# Patient Record
Sex: Female | Born: 1990 | Race: White | Hispanic: No | Marital: Single | Smoking: Never smoker
Health system: Southern US, Community
[De-identification: ages and names within clinical notes are randomized; demographics above are authoritative.]

## PROBLEM LIST (undated history)

## (undated) DIAGNOSIS — IMO0002 Reserved for concepts with insufficient information to code with codable children: Secondary | ICD-10-CM

## (undated) DIAGNOSIS — Z975 Presence of (intrauterine) contraceptive device: Secondary | ICD-10-CM

## (undated) DIAGNOSIS — N63 Unspecified lump in unspecified breast: Secondary | ICD-10-CM

## (undated) HISTORY — DX: Presence of (intrauterine) contraceptive device: Z97.5

## (undated) HISTORY — PX: INTRAUTERINE DEVICE (IUD) INSERTION: SHX5877

## (undated) HISTORY — DX: Reserved for concepts with insufficient information to code with codable children: IMO0002

## (undated) HISTORY — PX: COLPOSCOPY: SHX161

---
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
## 2008-02-07 ENCOUNTER — Ambulatory Visit: Payer: Self-pay | Admitting: Obstetrics and Gynecology

## 2010-05-15 ENCOUNTER — Emergency Department (HOSPITAL_BASED_OUTPATIENT_CLINIC_OR_DEPARTMENT_OTHER)
Admission: EM | Admit: 2010-05-15 | Discharge: 2010-05-15 | Payer: Self-pay | Source: Home / Self Care | Admitting: Emergency Medicine

## 2010-05-23 HISTORY — PX: BREAST BIOPSY: SHX20

## 2010-08-02 LAB — CBC
HCT: 39.4 % (ref 36.0–46.0)
Hemoglobin: 14.4 g/dL (ref 12.0–15.0)
MCV: 78.2 fL (ref 78.0–100.0)
WBC: 6.4 10*3/uL (ref 4.0–10.5)

## 2010-08-02 LAB — DIFFERENTIAL
Basophils Absolute: 0 10*3/uL (ref 0.0–0.1)
Basophils Relative: 1 % (ref 0–1)
Eosinophils Absolute: 0.1 10*3/uL (ref 0.0–0.7)
Eosinophils Relative: 2 % (ref 0–5)
Monocytes Absolute: 0.3 10*3/uL (ref 0.1–1.0)
Monocytes Relative: 5 % (ref 3–12)

## 2010-08-02 LAB — BASIC METABOLIC PANEL
CO2: 24 mEq/L (ref 19–32)
Calcium: 9.6 mg/dL (ref 8.4–10.5)
GFR calc Af Amer: >60 mL/min (ref >60)
GFR calc non Af Amer: >60 mL/min (ref >60)
Sodium: 142 mEq/L (ref 135–145)

## 2010-08-02 LAB — POCT TOXICOLOGY PANEL

## 2010-08-02 LAB — ETHANOL: Alcohol, Ethyl (B): 209 mg/dL — ABNORMAL HIGH (ref 0–10)

## 2010-09-14 ENCOUNTER — Ambulatory Visit: Payer: Self-pay | Admitting: Obstetrics and Gynecology

## 2010-09-30 ENCOUNTER — Other Ambulatory Visit (HOSPITAL_COMMUNITY)
Admission: RE | Admit: 2010-09-30 | Discharge: 2010-09-30 | Disposition: A | Discharge status: Home / Self Care | Payer: Commercial Indemnity | Source: Ambulatory Visit | Attending: Obstetrics and Gynecology | Admitting: Obstetrics and Gynecology

## 2010-09-30 ENCOUNTER — Other Ambulatory Visit: Payer: Self-pay | Admitting: Obstetrics and Gynecology

## 2010-09-30 ENCOUNTER — Ambulatory Visit (INDEPENDENT_AMBULATORY_CARE_PROVIDER_SITE_OTHER): Payer: Commercial Indemnity | Admitting: Obstetrics and Gynecology

## 2010-09-30 DIAGNOSIS — Z23 Encounter for immunization: Secondary | ICD-10-CM

## 2010-09-30 DIAGNOSIS — Z124 Encounter for screening for malignant neoplasm of cervix: Secondary | ICD-10-CM | POA: Insufficient documentation

## 2010-09-30 DIAGNOSIS — Z01419 Encounter for gynecological examination (general) (routine) without abnormal findings: Secondary | ICD-10-CM

## 2010-09-30 DIAGNOSIS — Z113 Encounter for screening for infections with a predominantly sexual mode of transmission: Secondary | ICD-10-CM

## 2010-10-14 ENCOUNTER — Ambulatory Visit (INDEPENDENT_AMBULATORY_CARE_PROVIDER_SITE_OTHER): Payer: Commercial Indemnity | Admitting: Obstetrics and Gynecology

## 2010-10-14 ENCOUNTER — Ambulatory Visit: Payer: Commercial Indemnity | Admitting: Obstetrics and Gynecology

## 2010-10-14 ENCOUNTER — Other Ambulatory Visit: Payer: Self-pay | Admitting: Obstetrics and Gynecology

## 2010-10-14 DIAGNOSIS — N870 Mild cervical dysplasia: Secondary | ICD-10-CM

## 2010-10-22 DIAGNOSIS — Z975 Presence of (intrauterine) contraceptive device: Secondary | ICD-10-CM

## 2010-10-22 HISTORY — DX: Presence of (intrauterine) contraceptive device: Z97.5

## 2010-11-10 ENCOUNTER — Ambulatory Visit (INDEPENDENT_AMBULATORY_CARE_PROVIDER_SITE_OTHER): Payer: Commercial Indemnity | Admitting: Obstetrics and Gynecology

## 2010-11-10 DIAGNOSIS — Z30431 Encounter for routine checking of intrauterine contraceptive device: Secondary | ICD-10-CM

## 2010-11-10 DIAGNOSIS — Z113 Encounter for screening for infections with a predominantly sexual mode of transmission: Secondary | ICD-10-CM

## 2010-11-25 ENCOUNTER — Ambulatory Visit (INDEPENDENT_AMBULATORY_CARE_PROVIDER_SITE_OTHER): Payer: Commercial Indemnity | Admitting: Obstetrics and Gynecology

## 2010-11-25 DIAGNOSIS — N6019 Diffuse cystic mastopathy of unspecified breast: Secondary | ICD-10-CM

## 2010-11-25 DIAGNOSIS — N946 Dysmenorrhea, unspecified: Secondary | ICD-10-CM

## 2011-01-13 ENCOUNTER — Ambulatory Visit: Payer: Commercial Indemnity

## 2011-02-01 ENCOUNTER — Other Ambulatory Visit: Payer: Self-pay | Admitting: *Deleted

## 2011-02-01 ENCOUNTER — Ambulatory Visit (INDEPENDENT_AMBULATORY_CARE_PROVIDER_SITE_OTHER): Payer: Commercial Indemnity | Admitting: *Deleted

## 2011-02-01 DIAGNOSIS — Z23 Encounter for immunization: Secondary | ICD-10-CM

## 2011-05-24 DIAGNOSIS — IMO0002 Reserved for concepts with insufficient information to code with codable children: Secondary | ICD-10-CM

## 2011-05-24 HISTORY — DX: Reserved for concepts with insufficient information to code with codable children: IMO0002

## 2011-06-14 ENCOUNTER — Encounter: Payer: Self-pay | Admitting: *Deleted

## 2011-06-14 NOTE — Progress Notes (Signed)
Patient ID: Kathleen Carrillo, female   DOB: 1990-08-05, 21 y.o.   MRN: [id number] Pt mother(Jill) called wanting to know if daughter had 1 more shot for gardasil. Pt last shot will be this month. She will relay to daughter

## 2011-06-20 ENCOUNTER — Ambulatory Visit: Payer: Commercial Indemnity

## 2011-06-27 ENCOUNTER — Ambulatory Visit (INDEPENDENT_AMBULATORY_CARE_PROVIDER_SITE_OTHER): Payer: Commercial Indemnity | Admitting: Anesthesiology

## 2011-06-27 DIAGNOSIS — Z23 Encounter for immunization: Secondary | ICD-10-CM

## 2011-06-28 ENCOUNTER — Encounter: Payer: Self-pay | Admitting: Gynecology

## 2011-06-28 DIAGNOSIS — IMO0002 Reserved for concepts with insufficient information to code with codable children: Secondary | ICD-10-CM | POA: Insufficient documentation

## 2011-07-08 ENCOUNTER — Encounter: Payer: Self-pay | Admitting: Obstetrics and Gynecology

## 2011-07-08 ENCOUNTER — Other Ambulatory Visit (HOSPITAL_COMMUNITY)
Admission: RE | Admit: 2011-07-08 | Discharge: 2011-07-08 | Disposition: A | Discharge status: Home / Self Care | Payer: Commercial Indemnity | Source: Ambulatory Visit | Attending: Obstetrics and Gynecology | Admitting: Obstetrics and Gynecology

## 2011-07-08 ENCOUNTER — Ambulatory Visit (INDEPENDENT_AMBULATORY_CARE_PROVIDER_SITE_OTHER): Payer: Commercial Indemnity | Admitting: Obstetrics and Gynecology

## 2011-07-08 DIAGNOSIS — N949 Unspecified condition associated with female genital organs and menstrual cycle: Secondary | ICD-10-CM

## 2011-07-08 DIAGNOSIS — N898 Other specified noninflammatory disorders of vagina: Secondary | ICD-10-CM

## 2011-07-08 DIAGNOSIS — R102 Pelvic and perineal pain: Secondary | ICD-10-CM

## 2011-07-08 DIAGNOSIS — N870 Mild cervical dysplasia: Secondary | ICD-10-CM

## 2011-07-08 DIAGNOSIS — Z01419 Encounter for gynecological examination (general) (routine) without abnormal findings: Secondary | ICD-10-CM | POA: Insufficient documentation

## 2011-07-08 LAB — URINALYSIS W MICROSCOPIC + REFLEX CULTURE
Hgb urine dipstick: NEGATIVE
Nitrite: NEGATIVE
Specific Gravity, Urine: 1.02 (ref 1.005–1.030)
Urobilinogen, UA: 0.2 mg/dL (ref 0.0–1.0)

## 2011-07-08 LAB — WET PREP FOR TRICH, YEAST, CLUE
Clue Cells Wet Prep HPF POC: NONE SEEN
Yeast Wet Prep HPF POC: NONE SEEN

## 2011-07-08 NOTE — Progress Notes (Signed)
Patient came to see me today because she is concerned about her IUD. She feels pinching at the level of her cervix 1-2 times per week. It is not uncomfortable. Intercourse is fine. She is having no vaginal discharge, itching, or odor. She is due for a followup Pap smear. She has amenorrhea with just occasional light staining for a period.  Pelvic exam: External within normal limits. BUS within normal limits. Vaginal exam within normal limits. Cervix is clean without lesions. IUD string visible. Uterus is normal size and shape. Adnexa failed to reveal masses. Rectovaginal examination is confirmatory and without masses. Theotis Barrio present.  Urinalysis and wet prep negative.  Assessment: Normal GYN exam. CIN-1.  Plan: Patient reassured. Pap done. Advise followup in 6 months. Instructed patient on how to feel for her IUD string.

## 2011-08-09 ENCOUNTER — Ambulatory Visit (INDEPENDENT_AMBULATORY_CARE_PROVIDER_SITE_OTHER): Payer: Commercial Indemnity | Admitting: Obstetrics and Gynecology

## 2011-08-09 DIAGNOSIS — D069 Carcinoma in situ of cervix, unspecified: Secondary | ICD-10-CM

## 2011-08-09 DIAGNOSIS — N870 Mild cervical dysplasia: Secondary | ICD-10-CM

## 2011-08-09 NOTE — Progress Notes (Signed)
Subjective:     Patient ID: Kathleen Carrillo, female   DOB: April 06, 1991, 21 y.o.   MRN: [id number]  HPIlast year we did a colposcopy on the patient due to an abnormal Pap smear. Biopsy showed CIN-1 and the plan was to observe. Recently we did a followup Pap which showed CIN-1 with some cells suggestive of high-grade dysplasia. As a result of this she comes today for followup colposcopy.   Review of Systemsno change     Objective:   Physical Exam  Genitourinary:    colposcopy     Assessment:    CIN    Plan:    biopsies done of #2-#4. We will call patient with path

## 2012-01-10 ENCOUNTER — Other Ambulatory Visit (HOSPITAL_COMMUNITY)
Admission: RE | Admit: 2012-01-10 | Discharge: 2012-01-10 | Disposition: A | Discharge status: Home / Self Care | Payer: Commercial Indemnity | Source: Ambulatory Visit | Attending: Obstetrics and Gynecology | Admitting: Obstetrics and Gynecology

## 2012-01-10 ENCOUNTER — Encounter: Payer: Self-pay | Admitting: Obstetrics and Gynecology

## 2012-01-10 ENCOUNTER — Ambulatory Visit (INDEPENDENT_AMBULATORY_CARE_PROVIDER_SITE_OTHER): Payer: Commercial Indemnity | Admitting: Obstetrics and Gynecology

## 2012-01-10 VITALS — BP 112/60 | Ht 64.0 in | Wt 122.0 lb

## 2012-01-10 DIAGNOSIS — Z01419 Encounter for gynecological examination (general) (routine) without abnormal findings: Secondary | ICD-10-CM

## 2012-01-10 DIAGNOSIS — R102 Pelvic and perineal pain: Secondary | ICD-10-CM

## 2012-01-10 DIAGNOSIS — Z113 Encounter for screening for infections with a predominantly sexual mode of transmission: Secondary | ICD-10-CM

## 2012-01-10 DIAGNOSIS — N898 Other specified noninflammatory disorders of vagina: Secondary | ICD-10-CM

## 2012-01-10 DIAGNOSIS — L293 Anogenital pruritus, unspecified: Secondary | ICD-10-CM

## 2012-01-10 DIAGNOSIS — D249 Benign neoplasm of unspecified breast: Secondary | ICD-10-CM

## 2012-01-10 DIAGNOSIS — N949 Unspecified condition associated with female genital organs and menstrual cycle: Secondary | ICD-10-CM

## 2012-01-10 LAB — CBC WITH DIFFERENTIAL/PLATELET
HCT: 38.9 % (ref 36.0–46.0)
Lymphs Abs: 1.7 10*3/uL (ref 0.7–4.0)
MCV: 83.5 fL (ref 78.0–100.0)
Monocytes Absolute: 0.4 10*3/uL (ref 0.1–1.0)
Neutrophils Relative %: 65 % (ref 43–77)
RBC: 4.66 MIL/uL (ref 3.87–5.11)
RDW: 12.7 % (ref 11.5–15.5)
WBC: 6.6 10*3/uL (ref 4.0–10.5)

## 2012-01-10 LAB — WET PREP FOR TRICH, YEAST, CLUE: Clue Cells Wet Prep HPF POC: NONE SEEN

## 2012-01-10 MED ORDER — TERCONAZOLE 0.8 % VA CREA: 1.0000 | TOPICAL_CREAM | Freq: Every day | VAGINAL | Status: AC

## 2012-01-10 NOTE — Progress Notes (Signed)
Patient came to see me today for her annual GYN exam. Last year her Pap smear showed CIN-1. She underwent colposcopy with biopsy on 10/14/2010 with findings CIN-1. In February, 2013 patient had a followup Pap smear which showed CIN-1 with some cells suggestive of a higher grade lesion. She had a second colposcopy with biopsy which only showed CIN-1. She has a persistent breast lesion in her left breast which has been biopsied and is consistent with a fibroadenoma. She is currently having vaginal itching. She has had dyspareunia on deep penetration the last 3 times they had intercourse. She is having light staining every cycle with a Mirena IUD and occasional dysmenorrhea but no heavy bleeding.  Physical examination: kim Julian Reil present. HEENT within normal limits. Neck: Thyroid not large. No masses. Supraclavicular nodes: not enlarged. Breasts: Examined in both sitting and lying  position. No skin changes and no masses in right breast. In the left breast at 6:00 there is a 1.5 cm dominant nodule biopsy-proven fibroadenoma. Abdomen: Soft no guarding rebound or masses or hernia. Pelvic: External: Within normal limits. BUS: Within normal limits. Vaginal:within normal limits. Wet prep positive for yeast. Good estrogen effect. No evidence of cystocele rectocele or enterocele. Cervix: clean. IUD string visible. Uterus: Normal size and shape but tender. Adnexa: No masses. Rectovaginal exam: Confirmatory and negative. Extremities: Within normal limits.  Assessment: #1. CIN-1 #2. Dyspareunia #3. Fibroadenoma left breast #4. Yeast vaginitis  Plan: Terconazole 3 cream. Pelvic ultrasound. We'll decide on next Pap smear after receiving results of this 1.

## 2012-01-10 NOTE — Patient Instructions (Addendum)
Schedule ultrasound

## 2012-01-11 LAB — URINALYSIS W MICROSCOPIC + REFLEX CULTURE
Bacteria, UA: NONE SEEN
Hgb urine dipstick: NEGATIVE
Specific Gravity, Urine: 1.021 (ref 1.005–1.030)

## 2012-01-12 LAB — URINE CULTURE: Organism ID, Bacteria: NO GROWTH

## 2012-01-13 ENCOUNTER — Ambulatory Visit (INDEPENDENT_AMBULATORY_CARE_PROVIDER_SITE_OTHER): Payer: Commercial Indemnity

## 2012-01-13 ENCOUNTER — Ambulatory Visit (INDEPENDENT_AMBULATORY_CARE_PROVIDER_SITE_OTHER): Payer: Commercial Indemnity | Admitting: Obstetrics and Gynecology

## 2012-01-13 DIAGNOSIS — R102 Pelvic and perineal pain: Secondary | ICD-10-CM

## 2012-01-13 DIAGNOSIS — IMO0002 Reserved for concepts with insufficient information to code with codable children: Secondary | ICD-10-CM

## 2012-01-13 DIAGNOSIS — N949 Unspecified condition associated with female genital organs and menstrual cycle: Secondary | ICD-10-CM

## 2012-01-13 NOTE — Progress Notes (Signed)
The patient came back today to have an ultrasound because of dyspareunia. She has a Mirena IUD and I want to be sure it is not misplaced or that she does not have any pathology. On ultrasound she has a normal uterus with a Mirena IUD properly placed. Her endometrial echo is 3 mm. Her ovaries are both normal. Her cul-de-sac is free of fluid. The patient was reassured. I am hopeful that when her yeast infection is treated her dyspareunia will disappear. She will let  me know if it does not. Her Pap smear came back showing low-grade dysplasia. I informed her today. Based on new guidelines we will repeat it in one year.

## 2012-04-23 ENCOUNTER — Ambulatory Visit (INDEPENDENT_AMBULATORY_CARE_PROVIDER_SITE_OTHER): Payer: Commercial Indemnity | Admitting: Obstetrics and Gynecology

## 2012-04-23 DIAGNOSIS — B373 Candidiasis of vulva and vagina: Secondary | ICD-10-CM

## 2012-04-23 DIAGNOSIS — B3731 Acute candidiasis of vulva and vagina: Secondary | ICD-10-CM

## 2012-04-23 DIAGNOSIS — L293 Anogenital pruritus, unspecified: Secondary | ICD-10-CM

## 2012-04-23 DIAGNOSIS — N898 Other specified noninflammatory disorders of vagina: Secondary | ICD-10-CM

## 2012-04-23 LAB — WET PREP FOR TRICH, YEAST, CLUE
Trich, Wet Prep: NONE SEEN
Yeast Wet Prep HPF POC: NONE SEEN

## 2012-04-23 NOTE — Patient Instructions (Signed)
Continue yearly Pap smears.

## 2012-04-23 NOTE — Progress Notes (Signed)
Patient came in today to be sure she did not have a yeast infection. We have treated her with terconazole 3 cream. She was still having a little itching and discharge so she used to fourth night. She is currently not bothered by either discharge or itching.  Exam: Kennon Portela present. External and vaginal exam within normal limits without excessive discharge or evidence of irritation. Wet prep is negative. Cervix is clean. IUD string is visible.  Assessment: Yeast vaginitis  Plan: Patient reassured. Patient's Pap showed CIN-1. She has now had 2 colposcopies without evidence of high-grade dysplasia. I suggested she have another Pap smear at her annual exam.

## 2012-05-11 ENCOUNTER — Ambulatory Visit (INDEPENDENT_AMBULATORY_CARE_PROVIDER_SITE_OTHER): Payer: Commercial Indemnity | Admitting: Gynecology

## 2012-05-11 ENCOUNTER — Encounter: Payer: Self-pay | Admitting: Gynecology

## 2012-05-11 DIAGNOSIS — K1370 Unspecified lesions of oral mucosa: Secondary | ICD-10-CM

## 2012-05-11 NOTE — Progress Notes (Signed)
Patient presents with a several day history of a crusting area on her lower lip that she was worried could be HSV. Her prior boyfriend had HSV 1 and she wondered whether he could have given to her. Her boss at work also has HSV 1 and she's worried that she could have gotten this from touching objects that her boss touched. She does have a habit of biting her lower lip and this seemed to follow an episode of this also.  Exam HEENT with small linear erythematous area junction of her lip and skin inferiorly. No crusting weeping or ulcerative changes. Exam otherwise normal without adenopathy or other abnormalities.  Assessment and plan: Probable impetigo as it does not appear classic for HSV. Plan OTC antibiotic cream several times daily. Assuming it clears and will follow if it persists or worsen she knows to represent. I did recommend HSV 1 and 2 IgG levels just answer the question as to whether her boyfriend had exposure previously she'll get these drawn today.

## 2012-05-11 NOTE — Patient Instructions (Signed)
Use Neosporin type antibiotic cream on her lip 3-4 times daily. If area worsens or persists then follow up. Her resolve some will follow. Follow up for your blood results online

## 2012-05-13 LAB — HSV(HERPES SIMPLEX VRS) I + II AB-IGG: HSV 1 Glycoprotein G Ab, IgG: 1.2 IV — ABNORMAL HIGH

## 2012-09-24 ENCOUNTER — Ambulatory Visit: Payer: Commercial Indemnity | Admitting: Gynecology

## 2012-12-10 ENCOUNTER — Ambulatory Visit: Payer: Self-pay | Admitting: Nurse Practitioner

## 2012-12-14 ENCOUNTER — Ambulatory Visit (INDEPENDENT_AMBULATORY_CARE_PROVIDER_SITE_OTHER): Payer: Self-pay | Admitting: Gynecology

## 2012-12-14 ENCOUNTER — Encounter: Payer: Self-pay | Admitting: Gynecology

## 2012-12-14 VITALS — BP 100/74 | HR 66 | Resp 16 | Ht 63.75 in | Wt <= 1120 oz

## 2012-12-14 DIAGNOSIS — Z87898 Personal history of other specified conditions: Secondary | ICD-10-CM

## 2012-12-14 DIAGNOSIS — B009 Herpesviral infection, unspecified: Secondary | ICD-10-CM

## 2012-12-14 DIAGNOSIS — Z0000 Encounter for general adult medical examination without abnormal findings: Secondary | ICD-10-CM

## 2012-12-14 DIAGNOSIS — Z01419 Encounter for gynecological examination (general) (routine) without abnormal findings: Secondary | ICD-10-CM

## 2012-12-14 DIAGNOSIS — Z113 Encounter for screening for infections with a predominantly sexual mode of transmission: Secondary | ICD-10-CM

## 2012-12-14 DIAGNOSIS — Z8742 Personal history of other diseases of the female genital tract: Secondary | ICD-10-CM

## 2012-12-14 LAB — POCT URINALYSIS DIPSTICK
Leukocytes, UA: NEGATIVE
Urobilinogen, UA: NEGATIVE
pH, UA: 5

## 2012-12-14 MED ORDER — VALACYCLOVIR HCL 1 G PO TABS: 1000.0000 mg | ORAL_TABLET | Freq: Every day | ORAL | Status: DC

## 2012-12-14 NOTE — Progress Notes (Signed)
22 y.o. Single Caucasian female   G0P0 here for annual exam. Pt is  currently sexually active.  She reports not using condoms on a regular basis.  First sexual activity at 22 years old, 3 number of lifetime partners. Pt reports some dyspareunia in past, denies issue with dryness, current partner for week.  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  Patient's last menstrual period was 12/14/2009.          Sexually active: yes  The current method of family planning is IUD.    Exercising: yes  running, weight lifting 2x/wk Last pap: 01/10/12 LGSIL Alcohol:  Wine 1-2/wk Tobacco: no Drugs: no Gardisil: yes, completed: 2013  Urine: Negative  Health Maintenance  Topic Date Due  . Tetanus/tdap  07/17/2007  . Influenza Vaccine  01/21/2013  . Pap Smear  01/10/2015    Family History  Problem Relation Age of Onset  . Hypertension Father   . Heart disease Maternal Grandfather   . Diabetes Maternal Grandfather     Patient Active Problem List   Diagnosis Date Noted  . 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 LGSIL (low grade squamous intraepithelial dysplasia)     Past Medical History  Diagnosis Date  . LGSIL (low grade squamous intraepithelial dysplasia) 2012    Past Surgical History  Procedure Laterality Date  . Colposcopy    . Breast biopsy Left 2012    Allergies: Sudafed and Dayquil  Current Outpatient Prescriptions  Medication Sig Dispense Refill  . UNABLE TO FIND Med Name: Herbal Hair Skin and Nails.      Marland Kitchen levonorgestrel (MIRENA) 20 MCG/24HR IUD 1 each by Intrauterine route once.       No current facility-administered medications for this visit.    ROS: Pertinent items are noted in HPI.  Exam:    BP 100/74  Pulse 66  Resp 16  Ht 5' 3.75" (1.619 m)  Wt 18 lb (8.165 kg)   BMI 3.12 kg/m2  LMP 12/14/2009 Weight change: @WEIGHTCHANGE @ Last 3 height recordings:  Ht Readings from Last 3 Encounters:  12/14/12 5' 3.75" (1.619 m)  01/10/12 5\' 4"  (1.626 m)   General appearance: alert, cooperative and appears stated age Head: Normocephalic, without obvious abnormality, atraumatic Neck: no adenopathy, no carotid bruit, no JVD, supple, symmetrical, trachea midline and thyroid not enlarged, symmetric, no tenderness/mass/nodules Lungs: clear to auscultation bilaterally Breasts: normal appearance, no masses or tenderness, fibrocystic Heart: regular rate and rhythm, S1, S2 normal, no murmur, click, rub or gallop Abdomen: soft, non-tender; bowel sounds normal; no masses,  no organomegaly Extremities: extremities normal, atraumatic, no cyanosis or edema Skin: Skin color, texture, turgor normal. No rashes or lesions Lymph nodes: Cervical, supraclavicular, and axillary nodes normal. no inguinal nodes palpated Neurologic: Grossly normal   Pelvic: External genitalia:  no lesions              Urethra: normal appearing urethra with no masses, tenderness or lesions              Bartholins and Skenes: normal                 Vagina: normal appearing vagina with normal color and discharge, no lesions              Cervix: normal appearance  Pap taken: yes        Bimanual Exam:  Uterus:  uterus is normal size, shape, consistency and nontender                                      Adnexa:    normal adnexa in size, nontender and no masses                                      Rectovaginal: Deferred                                      Anus:  defer exam  A: well woman Contraceptive management STD screen     P: pap smear with reflex counseled on STD prevention return annually or prn Discussed STD prevention, regular condom use. Discussed HSV outbreak, to avoid sharing razors, towels etc with siblings, suggested trying suppression. Discussed difference between oral  and non-oral lesions, may need to consider suppression, will call in valtrex Spent extra 26m discussion HSV .50% face to face    An After Visit Summary was printed and given to the patient.

## 2012-12-15 LAB — GC/CHLAMYDIA PROBE AMP, URINE
Chlamydia, Swab/Urine, PCR: NEGATIVE
GC Probe Amp, Urine: NEGATIVE

## 2012-12-25 ENCOUNTER — Encounter: Payer: Self-pay | Admitting: Gynecology

## 2013-04-04 ENCOUNTER — Telehealth: Payer: Self-pay | Admitting: Gynecology

## 2013-04-04 NOTE — Telephone Encounter (Signed)
Attempt to return call, no answer and VM msg states "VM not set up".

## 2013-04-04 NOTE — Telephone Encounter (Signed)
Patient had questions for lathrop about what she could take for an outbreak on her face... Anything that could be prescribed or over the counter.(no chart)

## 2013-04-05 NOTE — Telephone Encounter (Signed)
Pt says rx for valtrex is $200.00. Wondering if there is anything else that can be prescribed for her because she is still having the outbreak on her face and would like to make sure valtrex is going to work before she spend $200.00.

## 2013-04-05 NOTE — Telephone Encounter (Signed)
Patient has not filled RX written back in July due to cost.  Now has outbreak on her face and was considering picking up RX if med would work for oral outbreak. Advised the medication is the same but when written specifically for oral outbreaks, dose may be different. She states she does not have insurance and needs least expensive option. Advised to make sure they are giving her generic and to check at other pharm for best cash price. She can not come in due to cost.  Wont be able to pick up medication until next Wed. Advised i will send to dr Farrel Gobble to see if there is another option and call her back next week.

## 2013-04-08 NOTE — Telephone Encounter (Signed)
She can try acyclovir 400mg  BID and can give her less-5d worth to keep cost down, [facility] is cheapest, no membership needed, see if that works for her and we can call in

## 2013-04-09 NOTE — Telephone Encounter (Signed)
Patient was calling to see if you had heard from lathrop. She said she goes in at 2 so if it is after that then call he at work.

## 2013-04-09 NOTE — Telephone Encounter (Signed)
Patient notified of Dr Liliana Cline instruction.  Patient has actually taken this before has an old bottle that she has run out of.  Only problem was that while she was on it, she had another outbreak occur.  Advised can try again and see is this clears her and is not, call back.  If gets another outbreak, can check with Dr Farrel Gobble regarding daily dose.  Patient states she knows this was less expensive so she would actually like to have 30 pills instead of just 10. RX to [facility].

## 2013-04-11 ENCOUNTER — Other Ambulatory Visit: Payer: Self-pay | Admitting: Orthopedic Surgery

## 2013-04-11 MED ORDER — ACYCLOVIR 400 MG PO TABS: 400.0000 mg | ORAL_TABLET | Freq: Two times a day (BID) | ORAL | Status: DC

## 2013-04-11 NOTE — Telephone Encounter (Signed)
Patient says her prescription is  not at her pharmacy. Please call with status.

## 2013-04-11 NOTE — Telephone Encounter (Addendum)
Dr. Farrel Gobble,   OK for acyclovir 400 mg BID?  Pt would like 30 pills as she knows this med is cheaper. Sent to [facility] in [place].

## 2013-04-12 NOTE — Telephone Encounter (Signed)
yes

## 2013-04-25 ENCOUNTER — Other Ambulatory Visit: Payer: Self-pay | Admitting: Gynecology

## 2013-09-30 ENCOUNTER — Other Ambulatory Visit: Payer: Self-pay | Admitting: Gynecology

## 2013-09-30 NOTE — Telephone Encounter (Signed)
Last EAX 12/14/12 Last refill 04/25/13 #30/1 refill No future appt.   Rx sent per Telephone note from 04/04/13. 30 days only. Patient will need AEX for further refills.

## 2013-10-07 ENCOUNTER — Telehealth: Payer: Self-pay | Admitting: Gynecology

## 2013-10-07 NOTE — Telephone Encounter (Signed)
Patient has some questions for the nurse about acyclovir. She is hoping for a new RX with larger doses as she is requesting refills often.  [facility] [address] [place]

## 2013-10-08 NOTE — Telephone Encounter (Signed)
Dr. Charlies Constable,  Patient is requesting medication refills of Acyclovir 400 mg bid. She is taking daily and still having oral outbreaks. She feels tingling on her face and lesions come in. I advised that per your note on refill from 09/30/13 that she will need office visit to discuss and for further refills.  Patient is agreeable and office visit scheduled for 10/23/13 with Dr. Charlies Constable. She states she has enough medication to last until appointment but would like to get rx for longer length of therapy.   Routing to provider for final review. Patient agreeable to disposition. Will close encounter

## 2013-10-23 ENCOUNTER — Ambulatory Visit: Payer: Self-pay | Admitting: Gynecology

## 2013-10-30 ENCOUNTER — Ambulatory Visit (INDEPENDENT_AMBULATORY_CARE_PROVIDER_SITE_OTHER): Payer: Self-pay | Admitting: Gynecology

## 2013-10-30 ENCOUNTER — Encounter: Payer: Self-pay | Admitting: Gynecology

## 2013-10-30 VITALS — BP 108/62 | HR 80 | Resp 14 | Ht 63.75 in | Wt 118.0 lb

## 2013-10-30 DIAGNOSIS — B009 Herpesviral infection, unspecified: Secondary | ICD-10-CM

## 2013-10-30 MED ORDER — ACYCLOVIR 400 MG PO TABS: 400.0000 mg | ORAL_TABLET | Freq: Two times a day (BID) | ORAL | Status: DC

## 2013-10-30 MED ORDER — PENCICLOVIR 1 % EX CREA: 1.0000 "application " | TOPICAL_CREAM | CUTANEOUS | Status: DC

## 2013-10-30 NOTE — Progress Notes (Signed)
Pt here with mother.  Pt states that she could not afford valtrex for HSV infection and instead is taking acyclovir, she will take twice a day and stop to stretch out her rx but will then feel facial tingling.  She states the tingling will resolve several hours after she takes it.  Pt states that she only had 2 other genital lesions since her first almost 1y ago. We discussed the exected decrease in flares as time goes on after initial exposure to HSV. She can chose to take the acyclovir twice a day or can try to take once a day and increase to twice daily if she is having symptoms of outbreaks.  If she finds that she is getting frequent outbreaks on the lower dose, she may want to stay at twice a day for an additional year. We refilled acyclovir BID #60 refill 9 Rx denavir for topical treatment  F/u late October 67m spent counseling, >50% face to face

## 2014-01-17 ENCOUNTER — Telehealth: Payer: Self-pay | Admitting: Gynecology

## 2014-01-17 NOTE — Telephone Encounter (Signed)
Spoke with patient. Patient states that she has noticed a "bad odor" for a month and a half. Was tested at a free clinic and was told that she did not have a bacterial infection but symptoms have continued after. "I can smell it through my clothes." Denies itching. "I always have discharge." Patient also needs to schedule follow up to discuss valtrex with Dr.Lathrop. States that she has not been taking two pills a day as "I have just gotten so busy but I always at least take one." Patient does not currently have insurance and states that she just wants to come in for this appointment and not her annual at this time. Advised annual is over due and will need to be schedule at appointment. Patient agreeable. Advised patient would check with Dr.Lathrop about best day and time and call patient back. She is agreeable.

## 2014-01-17 NOTE — Telephone Encounter (Signed)
Spoke with patient. Advised spoke with Dr.Lathrop and Starla. Patient will slide card for $350 at beginning of visit but cost will be adjusted based on what is done at appointment as patient is self pay. Per Dr.Lathrop patient will need annual with pap and wet prep. Patient is looking at around $150 for appointment. Patient is agreeable and would like to schedule. Offered appointment Wed 9/2 at 10:30am (per TL) but patient declines due to work schedule. Patient can only come in on Tuesday and Friday. Appointment scheduled for Friday at 11am. Patient agreeable to date and time.  Routing to provider for final review. Patient agreeable to disposition. Will close encounter

## 2014-01-17 NOTE — Telephone Encounter (Signed)
Pt received letter to call and schedule recheck appointment and she thinks she may have a bacterial infection.

## 2014-01-24 ENCOUNTER — Ambulatory Visit: Payer: Self-pay | Admitting: Gynecology

## 2014-02-14 ENCOUNTER — Encounter: Payer: Self-pay | Admitting: Gynecology

## 2014-02-14 ENCOUNTER — Ambulatory Visit (INDEPENDENT_AMBULATORY_CARE_PROVIDER_SITE_OTHER): Payer: Self-pay | Admitting: Gynecology

## 2014-02-14 ENCOUNTER — Ambulatory Visit: Payer: Self-pay | Admitting: Gynecology

## 2014-02-14 VITALS — BP 106/60 | HR 64 | Resp 16 | Ht 63.75 in | Wt 117.0 lb

## 2014-02-14 DIAGNOSIS — Z124 Encounter for screening for malignant neoplasm of cervix: Secondary | ICD-10-CM

## 2014-02-14 DIAGNOSIS — N898 Other specified noninflammatory disorders of vagina: Secondary | ICD-10-CM

## 2014-02-14 DIAGNOSIS — Z01419 Encounter for gynecological examination (general) (routine) without abnormal findings: Secondary | ICD-10-CM

## 2014-02-14 DIAGNOSIS — B009 Herpesviral infection, unspecified: Secondary | ICD-10-CM

## 2014-02-14 MED ORDER — ACYCLOVIR 400 MG PO TABS: 400.0000 mg | ORAL_TABLET | Freq: Two times a day (BID) | ORAL | Status: DC

## 2014-02-14 NOTE — Progress Notes (Signed)
23 y.o. single, Caucasian female   G0P0 here for annual exam. Pt is not currently sexually active.   First sexual activity at 22 years old, 6 number of lifetime partners. Pt states that her facial HSV outbreaks are less intense and less frequent. Rare withdraw bleeding with IUD.   Pt reports vaginal odor, itch, change in vaginal discharge and dryness.    No LMP recorded. Patient is not currently having periods (Reason: IUD).          Sexually active: No.  The current method of family planning is IUD.    Exercising: Yes.    walk Last pap:12-14-12 neg, 2013-LGSIL Alcohol: 6 glasses of wine a week Tobacco: none Drugs: none Gardisil: yes, completed: 2013 Self breast exam: done occ   Health Maintenance  Topic Date Due  . Tetanus/tdap  07/17/2007  . Influenza Vaccine  12/21/2013  . Pap Smear  12/15/2015    Family History  Problem Relation Age of Onset  . Hypertension Father   . Heart disease Maternal Grandfather   . Diabetes Maternal Grandfather     Patient Active Problem List   Diagnosis Date Noted  . 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 LGSIL (low grade squamous intraepithelial dysplasia)     Past Medical History  Diagnosis Date  . LGSIL (low grade squamous intraepithelial dysplasia) 2013  . IUD contraception 10/2010    mirena    Past Surgical History  Procedure Laterality Date  . Colposcopy    . Breast biopsy Left 2012    Allergies: Sudafed and Dayquil  Current Outpatient Prescriptions  Medication Sig Dispense Refill  . acyclovir (ZOVIRAX) 400 MG tablet Take 1 tablet (400 mg total) by mouth 2 (two) times daily.  60 tablet  9  . levonorgestrel (MIRENA) 20 MCG/24HR IUD 1 each by Intrauterine route once.      Marland Kitchen LYSINE PO Take by mouth daily.       No current facility-administered medications for this visit.    ROS: Pertinent items are noted in HPI.  Exam:    BP 106/60  Pulse 64  Resp 16  Ht 5' 3.75" (1.619 m)  Wt 117 lb (53.071  kg)  BMI 20.25 kg/m2 Weight change: @WEIGHTCHANGE @ Last 3 height recordings:  Ht Readings from Last 3 Encounters:  02/14/14 5' 3.75" (1.619 m)  10/30/13 5' 3.75" (1.619 m)  12/14/12 5' 3.75" (1.619 m)   General appearance: alert, cooperative and appears stated age Head: Normocephalic, without obvious abnormality, atraumatic Neck: no adenopathy, no carotid bruit, no JVD, supple, symmetrical, trachea midline and thyroid not enlarged, symmetric, no tenderness/mass/nodules Lungs: clear to auscultation bilaterally Breasts: normal appearance, no masses or tenderness Heart: regular rate and rhythm, S1, S2 normal, no murmur, click, rub or gallop Abdomen: soft, non-tender; bowel sounds normal; no masses,  no organomegaly Extremities: extremities normal, atraumatic, no cyanosis or edema Skin: Skin color, texture, turgor normal. No rashes or lesions Lymph nodes: Cervical, supraclavicular, and axillary nodes normal. no inguinal nodes palpated Neurologic: Grossly normal   Pelvic: External genitalia:  no lesions              Urethra: normal appearing urethra with no masses, tenderness or lesions              Bartholins and Skenes: Bartholin's, Urethra, Skene's normal                 Vagina: normal appearing vagina with normal color and discharge, no lesions  Cervix: normal appearance, stings noted              Pap taken: Yes.          Bimanual Exam:  Uterus:  uterus is normal size, shape, consistency and nontender                                      Adnexa:    normal adnexa in size, nontender and no masses                                      Rectovaginal: Confirms                                      Anus:  normal sphincter tone, no lesions   1. Routine gynecological examination  counseled on breast self exam, feminine hygiene, adequate intake of calcium and vitamin D, diet and exercise return annually or prn Discussed STD prevention, regular condom use.   2. Vaginal  discharge No signs of infection today, will treat based on PAP results, pt agreeable  3. HSV-1 infection Natural course of infection reviewed - acyclovir (ZOVIRAX) 400 MG tablet; Take 1 tablet (400 mg total) by mouth 2 (two) times daily.  Dispense: 60 tablet; Refill: 11  4. Screening for cervical cancer LGSIL 2013, normal 2014, guidelines reviewed - PAP with Reflex to HPV (IPS)   An After Visit Summary was printed and given to the patient.

## 2014-02-18 LAB — IPS PAP TEST WITH REFLEX TO HPV

## 2014-02-19 ENCOUNTER — Telehealth: Payer: Self-pay | Admitting: Gynecology

## 2014-02-19 NOTE — Telephone Encounter (Signed)
Pt says someone called her with results. No telephone call in system.

## 2014-02-19 NOTE — Telephone Encounter (Signed)
Spoke with patient. Advised of pap smear results. Patient states "So there was no yeast or bacterial infection?" Advised none was noted with pap smear. Patient states that she has been having a vaginal odor with her discharge and does not know what to do. Advised patient would send a message over to Dr.Lathrop to see what she recommends and give her a call back with further recommendations. Patient is agreeable.

## 2014-02-27 NOTE — Telephone Encounter (Signed)
Dr. Charlies Constable can you please review. Should patient come in for office visit? You advised you would treat based on pap and pap was normal.

## 2014-02-28 NOTE — Telephone Encounter (Signed)
Returned call to patient. She states that she has taken Dr. Brion Aliment advise and changed denim jeans and has not had any issues. She will follow up prn.  Routing to provider for final review. Patient agreeable to disposition. Will close encounter

## 2014-02-28 NOTE — Telephone Encounter (Signed)
There was no infection to treat, I don't think she needs to be seen again, but it is really up to her

## 2014-03-04 ENCOUNTER — Ambulatory Visit: Payer: Self-pay | Admitting: Gynecology

## 2014-07-01 ENCOUNTER — Other Ambulatory Visit: Payer: Self-pay | Admitting: Obstetrics & Gynecology

## 2014-07-01 DIAGNOSIS — B009 Herpesviral infection, unspecified: Secondary | ICD-10-CM

## 2014-07-01 NOTE — Telephone Encounter (Signed)
Medication refill request: acyclovir 400 mg Last AEX: 02/14/14 Next AEX: none Last MMG (if hormonal medication request): None Refill authorized: 02/14/14 #[id number]/ 11 Refills to [facility]. Patient should have refills left until 12/2014.   Called pharmacy, Caryl Pina states patient has Refills on file.   Patient notified.

## 2014-07-01 NOTE — Telephone Encounter (Signed)
Patient calling requesting refills on acyclovir. Pharmacy on file is correct.

## 2014-07-02 ENCOUNTER — Emergency Department (HOSPITAL_COMMUNITY)
Admission: EM | Admit: 2014-07-02 | Discharge: 2014-07-02 | Disposition: A | Discharge status: Home / Self Care | Payer: Commercial Indemnity | Attending: Emergency Medicine | Admitting: Emergency Medicine

## 2014-07-02 ENCOUNTER — Encounter (HOSPITAL_COMMUNITY): Payer: Self-pay | Admitting: Emergency Medicine

## 2014-07-02 DIAGNOSIS — Z8742 Personal history of other diseases of the female genital tract: Secondary | ICD-10-CM | POA: Insufficient documentation

## 2014-07-02 DIAGNOSIS — Z3202 Encounter for pregnancy test, result negative: Secondary | ICD-10-CM | POA: Insufficient documentation

## 2014-07-02 DIAGNOSIS — M545 Low back pain: Secondary | ICD-10-CM | POA: Insufficient documentation

## 2014-07-02 DIAGNOSIS — L729 Follicular cyst of the skin and subcutaneous tissue, unspecified: Secondary | ICD-10-CM

## 2014-07-02 DIAGNOSIS — Z79899 Other long term (current) drug therapy: Secondary | ICD-10-CM | POA: Insufficient documentation

## 2014-07-02 HISTORY — DX: Unspecified lump in unspecified breast: N63.0

## 2014-07-02 LAB — URINALYSIS, ROUTINE W REFLEX MICROSCOPIC
BILIRUBIN URINE: NEGATIVE
Glucose, UA: NEGATIVE mg/dL
Hgb urine dipstick: NEGATIVE
KETONES UR: NEGATIVE mg/dL
Leukocytes, UA: NEGATIVE
NITRITE: NEGATIVE
PH: 6 (ref 5.0–8.0)
PROTEIN: NEGATIVE mg/dL
Specific Gravity, Urine: 1.017 (ref 1.005–1.030)
UROBILINOGEN UA: 0.2 mg/dL (ref 0.0–1.0)

## 2014-07-02 LAB — POC URINE PREG, ED: Preg Test, Ur: NEGATIVE

## 2014-07-02 NOTE — ED Notes (Signed)
Pt c/o knot under skin at umbilicus that has been there for about a month. Pt doesn't think really increased in size but has gotten very painful.  Pt states that she has issues with her tonsils and doesn't know if the infection could be related.

## 2014-07-02 NOTE — ED Provider Notes (Signed)
CSN: [id number]     Arrival date & time 07/02/14  1424 History  This chart was scribed for non-physician practitioner  working with Tanna Furry, MD, by Ian Bushman, ED Scribe. This patient was seen in room WTR8/WTR8 and the patient's care was started at 3:31 PM.  First MD Initiated Contact with Patient 07/02/14 1518     Chief Complaint  Patient presents with  . knot at umbilicus      (Consider location/radiation/quality/duration/timing/severity/associated sxs/prior Treatment) HPI  HPI Comments: Kathleen Carrillo is a 24 y.o. female who presents to the Emergency Department complaining of a knot under her skin which has been there for about a month. Patient notes that the general pain to her cyst that started last night and denies any discharge from the area. No redness, swelling or severe pain. She also notes of slight back pain which started recently as well as decreased appetite, and burning with urination (has occasional foul smell of urine.)  Patient denies fevers/chills, nausea/vomitting, hematochezia, diarrhea.  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  Patient has no other complaints today. No loss of control of her bladder or bowel. No numbness or tingling to her lower extremities or saddle anesthesia.    Past Medical History  Diagnosis Date  . LGSIL (low grade squamous intraepithelial dysplasia) 2013  . IUD contraception 10/2010    mirena  . Benign breast lumps    Past Surgical History  Procedure Laterality Date  . Colposcopy    . Breast biopsy Left 2012   Family History  Problem Relation Age of Onset  . Hypertension Father   . Heart disease Maternal Grandfather   . Diabetes Maternal Grandfather    History  Substance Use Topics  . Smoking status: Never Smoker   . Smokeless tobacco: Not on file  . Alcohol Use: 3.6 oz/week    6 Glasses of wine per week     Comment: occasional   OB History    Gravida Para Term Preterm AB TAB SAB Ectopic  Multiple Living   0              Review of Systems  Constitutional: Positive for appetite change. Negative for fever and chills.  Gastrointestinal: Negative for nausea, vomiting, diarrhea, constipation and blood in stool.  Genitourinary: Positive for dysuria. Negative for difficulty urinating.  Musculoskeletal: Positive for back pain.  Skin: Negative for color change.      Allergies  Sudafed and Dayquil  Home Medications   Prior to Admission medications   Medication Sig Start Date End Date Taking? Authorizing Provider  acyclovir (ZOVIRAX) 400 MG tablet Take 1 tablet (400 mg total) by mouth 2 (two) times daily. 02/14/14   Elveria Rising, MD  levonorgestrel (MIRENA) 20 MCG/24HR IUD 1 each by Intrauterine route once.    Historical Provider, MD  LYSINE PO Take by mouth daily.    Historical Provider, MD   BP 131/63 mmHg  Pulse 81  Temp(Src) 98.6 F (37 C) (Oral)  Resp 18  SpO2 100% Physical Exam  Constitutional: She appears well-developed and well-nourished. No distress.  HENT:  Head: Normocephalic and atraumatic.  Eyes: Conjunctivae and EOM are normal. Right eye exhibits no discharge. Left eye exhibits no discharge.  Cardiovascular: Normal rate, regular rhythm and normal heart sounds.   Pulmonary/Chest: Effort normal and breath sounds normal. No respiratory distress. She has no wheezes.  Abdominal: Soft. Bowel sounds are normal. She exhibits no distension. There is no tenderness.  Musculoskeletal:  No midline back tenderness, step off or crepitus. Right and Left sided lower back tenderness. No CVA tenderness.  Neurological: She is alert. She exhibits normal muscle tone. Coordination normal.  Equal muscle tone. 5/5 strength in lower extremities. DTR equal and intact. Normal gait.  Skin: Skin is warm and dry. She is not diaphoretic.  1 cm cyst like structure proximal to umbilicus without any evidence of fluctuance.   No overlying skin changes no erythema Area is mildly tender.  It is not superficial.   Nursing note and vitals reviewed.   ED Course  Procedures (including critical care time) DIAGNOSTIC STUDIES: Oxygen Saturation is 100% on RA, normal by my interpretation.    COORDINATION OF CARE: 3:39 PM Discussed treatment plan with patient at beside, the patient agrees with the plan and has no further questions at this time.   Labs Review Labs Reviewed  URINALYSIS, ROUTINE W REFLEX MICROSCOPIC  POC URINE PREG, ED    Imaging Review No results found.   EKG Interpretation None      MDM   Final diagnoses:  Cyst of skin  Bilateral low back pain, with sciatica presence unspecified   Patient with sister month with increased tenderness to the area. No erythema, swelling. She is moving her bowels like normal with no generalized abdominal discomfort. VSS. Patient with cystlike structure [place] to umbilicus without evidence of infection. Is mildly tender. Is not superficial or fluctuance and would not be amenable to drainage in the ED. Patient given referral for general injury for possible removal if it continues to aggravate her. Patient also with some low back pain as well as urinary symptoms. Patient's UA without evidence of infection and patient is not pregnant. Patient given referral to the wellness center to establish care.  Discussed return precautions with patient. Discussed all results and patient verbalizes understanding and agrees with plan.  I personally performed the services described in this documentation, which was scribed in my presence. The recorded information has been reviewed and is accurate.   Pura Spice, PA-C 07/02/14 [place], MD 07/08/14 [phone]

## 2014-07-02 NOTE — Discharge Instructions (Signed)
Return to the emergency room with worsening of symptoms, new symptoms or with symptoms that are concerning , especially fevers, abdominal pain in one area, unable to keep down fluids, blood in stool or vomit, severe pain, you feel faint, lightheaded or pass out. RICE: Rest, Ice (three cycles of 20 mins on, 32mins off at least twice a day), compression/brace, elevation. Heating pad works well for back pain. Ibuprofen 400mg  (2 tablets 200mg ) every 5-6 hours for 3-5 days Follow up with PCP/orthopedist if symptoms worsen or are persistent. Read below information and follow recommendations.    Epidermal Cyst An epidermal cyst is sometimes called a sebaceous cyst, epidermal inclusion cyst, or infundibular cyst. These cysts usually contain a substance that looks "pasty" or "cheesy" and may have a bad smell. This substance is a protein called keratin. Epidermal cysts are usually found on the face, neck, or trunk. They may also occur in the vaginal area or other parts of the genitalia of both men and women. Epidermal cysts are usually small, painless, slow-growing bumps or lumps that move freely under the skin. It is important not to try to pop them. This may cause an infection and lead to tenderness and swelling. CAUSES  Epidermal cysts may be caused by a deep penetrating injury to the skin or a plugged hair follicle, often associated with acne. SYMPTOMS  Epidermal cysts can become inflamed and cause:  Redness.  Tenderness.  Increased temperature of the skin over the bumps or lumps.  Grayish-white, bad smelling material that drains from the bump or lump. DIAGNOSIS  Epidermal cysts are easily diagnosed by your caregiver during an exam. Rarely, a tissue sample (biopsy) may be taken to rule out other conditions that may resemble epidermal cysts. TREATMENT   Epidermal cysts often get better and disappear on their own. They are rarely ever cancerous.  If a cyst becomes infected, it may become inflamed  and tender. This may require opening and draining the cyst. Treatment with antibiotics may be necessary. When the infection is gone, the cyst may be removed with minor surgery.  Small, inflamed cysts can often be treated with antibiotics or by injecting steroid medicines.  Sometimes, epidermal cysts become large and bothersome. If this happens, surgical removal in your caregiver's office may be necessary. HOME CARE INSTRUCTIONS  Only take over-the-counter or prescription medicines as directed by your caregiver.  Take your antibiotics as directed. Finish them even if you start to feel better. SEEK MEDICAL CARE IF:   Your cyst becomes tender, red, or swollen.  Your condition is not improving or is getting worse.  You have any other questions or concerns. MAKE SURE YOU:  Understand these instructions.  Will watch your condition.  Will get help right away if you are not doing well or get worse. Document Released: 04/09/2004 Document Revised: 08/01/2011 Document Reviewed: 11/15/2010 [facility] Patient Information 2015 [place], [place]. This information is not intended to replace advice given to you by your health care provider. Make sure you discuss any questions you have with your health care provider.

## 2014-09-12 ENCOUNTER — Telehealth: Payer: Self-pay | Admitting: Obstetrics and Gynecology

## 2014-09-12 NOTE — Telephone Encounter (Signed)
Spoke with patient. Patient states that she has a Mirena IUD that is due for removal in one year. "Lately I have ben having cycles every month that are like a normal cycle but I have been getting irritable and craving sweets. I never used to have this with my Mirena. Is is possible that it is not working any more? Could I get pregnant?" Mirena was inserted 10/2010. Advised patient Mirena provides contraception for 5 years. Advised unless out of place would be providing accurate coverage. Patient is unable to feel IUD strings as she had them trimmed. Patient would like to know how much having her IUD removed and reinserted would cost as she is self pay. Advised would send a message to billing and have them call to discuss. Patient would like to schedule an appointment with a provider to discuss discomfort with intercourse. Denies any current discomfort.Appointment scheduled for 09/18/14 at 3:30pm with Dr.Silva. Patient is agreeable to date and time.  Routing to provider for final review. Patient agreeable to disposition. Will close encounter

## 2014-09-12 NOTE — Telephone Encounter (Signed)
Patient is seeking advise regarding her Mirena. Last seen 03/04/14, former TL patient.

## 2014-09-12 NOTE — Telephone Encounter (Signed)
Patient called back and said, "I want to make sure the nurse is aware I want to schedule an appointment too." She is not due for an AEX but wants to see an MD about her Mirena and ask the nurse some questions too.

## 2014-09-18 ENCOUNTER — Encounter: Payer: Self-pay | Admitting: Obstetrics and Gynecology

## 2014-09-18 ENCOUNTER — Ambulatory Visit (INDEPENDENT_AMBULATORY_CARE_PROVIDER_SITE_OTHER): Payer: Self-pay | Admitting: Obstetrics and Gynecology

## 2014-09-18 VITALS — BP 90/60 | HR 70 | Ht 63.75 in | Wt 118.6 lb

## 2014-09-18 DIAGNOSIS — R102 Pelvic and perineal pain: Secondary | ICD-10-CM

## 2014-09-18 DIAGNOSIS — T8332XA Displacement of intrauterine contraceptive device, initial encounter: Secondary | ICD-10-CM

## 2014-09-18 DIAGNOSIS — T8389XA Other specified complication of genitourinary prosthetic devices, implants and grafts, initial encounter: Secondary | ICD-10-CM

## 2014-09-18 LAB — POCT URINALYSIS DIPSTICK
Leukocytes, UA: NEGATIVE
Urobilinogen, UA: NEGATIVE
pH, UA: 6

## 2014-09-18 NOTE — Progress Notes (Signed)
Patient ID: Kathleen Carrillo, female   DOB: Jun 17, 1990, 24 y.o.   MRN: [id number]  GYNECOLOGY  VISIT   HPI: 24 y.o.   Single  Caucasian  female   G0P0 with No LMP recorded. Patient is not currently having periods (Reason: IUD).   here for pain with intercourse.  Has Mirena IUD place 2012.   Presents with mother and has multiple concerns.  Having irregular bleeding, back pain, and pain with intercourse.  Spotting every few months for 3 days usually.  Last two months is having a full menstruation.  Bleeding can be heavy.  Uncertain if having blood is coming from urine.  Denies painful urination.  Some increased cramping.  Having usual menstrual symptoms.   Also noted a lump on her left medial thigh.   New relationship.  With her prior partner in February had pain with intercourse as well.   In December had back pain and went to health department and was treated with Azithromycin for cervicitis. Never received results from the evaluation.  Back pain resolved.   Seen by another physician om follow up and was told there was cervicitis.  Treated with injection ? Rocephin and then treated with an oral medication ?Doxycycline. Had follow up and was told her GC/CT were negative.   Also has HSV.  Takes Acyclovir.  Questions about this.   UPT negative. Urine dip negative.   GYNECOLOGIC HISTORY: No LMP recorded. Patient is not currently having periods (Reason: IUD).          OB History    Gravida Para Term Preterm AB TAB SAB Ectopic Multiple Living   0                  Patient Active Problem List   Diagnosis Date Noted  . 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 LGSIL (low grade squamous intraepithelial dysplasia)     Past Medical History  Diagnosis Date  . LGSIL (low grade squamous intraepithelial dysplasia) 2013  . IUD contraception 10/2010    mirena  . Benign breast lumps     Past Surgical History  Procedure Laterality Date  . Colposcopy     . Breast biopsy Left 2012    Current Outpatient Prescriptions  Medication Sig Dispense Refill  . acyclovir (ZOVIRAX) 400 MG tablet Take 1 tablet (400 mg total) by mouth 2 (two) times daily. 60 tablet 11  . levonorgestrel (MIRENA) 20 MCG/24HR IUD 1 each by Intrauterine route once.    Marland Kitchen LYSINE PO Take by mouth daily.     No current facility-administered medications for this visit.     ALLERGIES: Sudafed and Dayquil  Family History  Problem Relation Age of Onset  . Hypertension Father   . Heart disease Maternal Grandfather   . Diabetes Maternal Grandfather     History   Social History  . Marital Status: Single    Spouse Name: N/A  . Number of Children: N/A  . Years of Education: N/A   Occupational History  . Not on file.   Social History Main Topics  . Smoking status: Never Smoker   . Smokeless tobacco: Not on file  . Alcohol Use: 3.6 oz/week    6 Glasses of wine per week     Comment: occasional  . Drug Use: No  . Sexual Activity:    Partners: Male    Birth Control/ Protection: IUD     Comment: Mirena inserted 11-10-10   Other Topics Concern  .  Not on file   Social History Narrative    ROS:  Pertinent items are noted in HPI.  PHYSICAL EXAMINATION:    BP 90/60 mmHg  Pulse 70  Ht 5' 3.75" (1.619 m)  Wt 118 lb 9.6 oz (53.797 kg)  BMI 20.52 kg/m2  LMP      General appearance: alert, cooperative and appears stated age   Abdomen: soft, non-tender; no masses,  no organomegaly No abnormal inguinal nodes palpated  Pelvic: External genitalia:   Small 4 mm sebaceous cyst of the left vulva.              Urethra:  normal appearing urethra with no masses, tenderness or lesions              Bartholins and Skenes: normal                 Vagina: normal appearing vagina with normal color and discharge, no lesions              Cervix: normal appearance.  IUD strings not seen.                   Bimanual Exam:  Uterus:  uterus is normal size, shape, consistency and  nontender                                      Adnexa: normal adnexa in size, nontender and no masses                                    ASSESSMENT  Dyspareunia.  No evidence of PID on exam.  Mirena IUD patient.  IUD strings not seen.  Resumption of menses and menstrual symptoms.  Treatment of cervicitis twice recently.   Negative cultures per patient.  History of HSV.   PLAN  Discussion of cervicitis.  Discussion of HSV and antiviral use.  Discussion of dyspareunia and menstrual bleeding.  I strongly recommend returning for a pelvic ultrasound to locate IUD and investigate her pain and bleeding.   An After Visit Summary was printed and given to the patient.  __25___ minutes face to face time of which over 50% was spent in counseling.

## 2014-09-22 ENCOUNTER — Telehealth: Payer: Self-pay | Admitting: Obstetrics and Gynecology

## 2014-09-22 NOTE — Telephone Encounter (Signed)
Call to patient. Advised of uninsured amount for PUS/OV. Also advised of uninsured amount for Mirena removal and reinsertion.  Patient agreeable. Patient scheduled PUS/OV. Advised patient of 72 hour cancellation policy and $747 cancellation fee. Patient agreeable.

## 2014-10-02 ENCOUNTER — Ambulatory Visit (INDEPENDENT_AMBULATORY_CARE_PROVIDER_SITE_OTHER): Payer: Self-pay | Admitting: Obstetrics and Gynecology

## 2014-10-02 ENCOUNTER — Encounter: Payer: Self-pay | Admitting: Obstetrics and Gynecology

## 2014-10-02 ENCOUNTER — Ambulatory Visit (INDEPENDENT_AMBULATORY_CARE_PROVIDER_SITE_OTHER): Payer: Self-pay

## 2014-10-02 VITALS — BP 98/70 | Resp 20 | Ht 63.75 in | Wt 117.0 lb

## 2014-10-02 DIAGNOSIS — R4586 Emotional lability: Secondary | ICD-10-CM

## 2014-10-02 DIAGNOSIS — T8389XD Other specified complication of genitourinary prosthetic devices, implants and grafts, subsequent encounter: Secondary | ICD-10-CM

## 2014-10-02 DIAGNOSIS — R102 Pelvic and perineal pain: Secondary | ICD-10-CM

## 2014-10-02 DIAGNOSIS — M549 Dorsalgia, unspecified: Secondary | ICD-10-CM

## 2014-10-02 DIAGNOSIS — T8389XA Other specified complication of genitourinary prosthetic devices, implants and grafts, initial encounter: Secondary | ICD-10-CM

## 2014-10-02 DIAGNOSIS — T8332XD Displacement of intrauterine contraceptive device, subsequent encounter: Secondary | ICD-10-CM

## 2014-10-02 DIAGNOSIS — F39 Unspecified mood [affective] disorder: Secondary | ICD-10-CM

## 2014-10-02 DIAGNOSIS — T8332XA Displacement of intrauterine contraceptive device, initial encounter: Secondary | ICD-10-CM

## 2014-10-02 NOTE — Progress Notes (Signed)
Subjective  24 y.o. G70P0 Caucasian female here for pelvic ultrasound for multiple issues.  Patient's mother and boyfriend are here today.   Resumption of cycles with Mirena IUD. Lost IUD threads.  Painful intercourse.  Left sided back pain.  Mood swings for the last month. 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  (Mother states she lifts things a man should be lifting.) Under stress.   New relationship.  Negative STD check at the Health Dept.   Objective  Pelvic ultrasound images and report reviewed with patient.  Uterus - no masses.   EMS - 5.08 mm.  IUD in endometrial canal. Ovaries - normal. Free fluid - no     Assessment  Dyspareunia.    Mirena IUD patient. Lost IUD strings.  IUD in proper position.  Back pain.  I suspect this is musculoskeletal in origin. Stress.  Plan  Reassurance regarding IUD position and functioning.  NSAIDs and rest for back pain.  I gave patient name and brochure for Marya Amsler, counselor with [employer].  Discussed briefly SSRIs.  No Rx at this time.  Follow up prn and for annual exams.   __15_____ minutes face to face time of which over 50% was spent in counseling.   After visit summary to patient.

## 2015-04-10 ENCOUNTER — Encounter: Payer: Self-pay | Admitting: Nurse Practitioner

## 2015-04-10 ENCOUNTER — Ambulatory Visit (INDEPENDENT_AMBULATORY_CARE_PROVIDER_SITE_OTHER): Payer: PRIVATE HEALTH INSURANCE | Admitting: Nurse Practitioner

## 2015-04-10 VITALS — BP 104/62 | HR 80 | Ht 63.75 in | Wt 119.0 lb

## 2015-04-10 DIAGNOSIS — R300 Dysuria: Secondary | ICD-10-CM | POA: Diagnosis not present

## 2015-04-10 DIAGNOSIS — N939 Abnormal uterine and vaginal bleeding, unspecified: Secondary | ICD-10-CM

## 2015-04-10 DIAGNOSIS — Z113 Encounter for screening for infections with a predominantly sexual mode of transmission: Secondary | ICD-10-CM

## 2015-04-10 MED ORDER — FLUCONAZOLE 150 MG PO TABS: 150.0000 mg | ORAL_TABLET | Freq: Once | ORAL | Status: DC

## 2015-04-10 NOTE — Patient Instructions (Addendum)

## 2015-04-10 NOTE — Progress Notes (Signed)
Patient ID: Kathleen Carrillo, female   DOB: 15-Jan-1991, 24 y.o.   MRN: [id number] 24 y.o. Single Caucasian female G0P0 here with complaint of vaginal symptoms of itching, burning, and increase discharge. Describes discharge as white.   Onset of symptoms 3 days ago. At work yesterday felt a lot of burning and itching both internal and external.  Spotting around Monday following SA.   Mirena insertion 10/2010.  Rare vaginal spotting since insertion of IUD that occurs about every 2-4 months for 2-3 days.    Ended last relationship 2 months ago after 7 months. New partner recently SA with a lot of pain at the introitus and stopped SA.  Since then had a lot of vaginitis symptoms.   Last pm used OTC Monistat cream.  Denies new personal products or vaginal dryness. ? STD concerns since this is a new partner.  Urinary symptoms only with wiping . Contraception is IUD.  Also history of abnormal pap and is past due for pap smear.   O:  Healthy female WDWN Affect: normal, orientation x 3  Exam: no acute distress Abdomen:  Soft and non tender Lymph node: no enlargement or tenderness Pelvic exam: External genital: normal female BUS: negative Vagina: copious amounts of Monistat cream making a collection less optimal even after swabbing.  Affirm is not taken. Cervix: normal, non tender, no CMT and IUD strings are available Uterus: normal, non tender Adnexa:normal, non tender, no masses or fullness noted  Urine:  2+ WBC,  UPT negative  A: Vaginitis possibly yeast  Request UPT due to bleeding with IUD  R/O STD  History of Mirena IUD 10/2010  History of abnormal pap   P: Discussed findings of vaginitis and etiology. Discussed Aveeno or baking soda sitz bath for comfort. Avoid moist clothes or pads for extended period of time. If working out in gym clothes or swim suits for long periods of time change underwear or bottoms of swimsuit if possible. Olive Oil/Coconut Oil use for skin protection prior to activity can be  used to external skin.  Rx: Diflucan 150 mg X 2  Will plan to recheck her next Wednesday  In the interim to use OTC antifungal externally prn comfort  She will go ahead and get some of STD's today.  Note is given that she was here today  RV prn

## 2015-04-11 LAB — STD PANEL
HEP B S AG: NEGATIVE
HIV: NONREACTIVE

## 2015-04-12 NOTE — Progress Notes (Signed)
Encounter reviewed by Dr. Brook Amundson C. Silva.  

## 2015-04-15 ENCOUNTER — Encounter: Payer: Self-pay | Admitting: Nurse Practitioner

## 2015-04-15 ENCOUNTER — Ambulatory Visit (INDEPENDENT_AMBULATORY_CARE_PROVIDER_SITE_OTHER): Payer: PRIVATE HEALTH INSURANCE | Admitting: Nurse Practitioner

## 2015-04-15 VITALS — BP 110/62 | HR 70 | Temp 98.8°F | Resp 12 | Ht 63.75 in | Wt 122.0 lb

## 2015-04-15 DIAGNOSIS — R87619 Unspecified abnormal cytological findings in specimens from cervix uteri: Secondary | ICD-10-CM | POA: Diagnosis not present

## 2015-04-15 DIAGNOSIS — R82998 Other abnormal findings in urine: Secondary | ICD-10-CM

## 2015-04-15 DIAGNOSIS — N390 Urinary tract infection, site not specified: Secondary | ICD-10-CM

## 2015-04-15 DIAGNOSIS — Z113 Encounter for screening for infections with a predominantly sexual mode of transmission: Secondary | ICD-10-CM | POA: Diagnosis not present

## 2015-04-15 LAB — POCT URINALYSIS DIPSTICK
Bilirubin, UA: NEGATIVE
Blood, UA: NEGATIVE
Glucose, UA: NEGATIVE
KETONES UA: NEGATIVE
NITRITE UA: NEGATIVE
PH UA: 7
PROTEIN UA: NEGATIVE
UROBILINOGEN UA: NEGATIVE

## 2015-04-15 MED ORDER — METRONIDAZOLE 0.75 % VA GEL: 1.0000 | Freq: Every day | VAGINAL | Status: DC

## 2015-04-15 MED ORDER — FLUCONAZOLE 150 MG PO TABS: 150.0000 mg | ORAL_TABLET | Freq: Once | ORAL | Status: DC

## 2015-04-15 NOTE — Patient Instructions (Signed)
Sexually Transmitted Disease °A sexually transmitted disease (STD) is a disease or infection that may be passed (transmitted) from person to person, usually during sexual activity. This may happen by way of saliva, semen, blood, vaginal mucus, or urine. Common STDs include: °· Gonorrhea. °· Chlamydia. °· Syphilis. °· HIV and AIDS. °· Genital herpes. °· Hepatitis B and C. °· Trichomonas. °· Human papillomavirus (HPV). °· Pubic lice. °· Scabies. °· Mites. °· Bacterial vaginosis. °WHAT ARE CAUSES OF STDs? °An STD may be caused by bacteria, a virus, or parasites. STDs are often transmitted during sexual activity if one person is infected. However, they may also be transmitted through nonsexual means. STDs may be transmitted after:  °· Sexual intercourse with an infected person. °· Sharing sex toys with an infected person. °· Sharing needles with an infected person or using unclean piercing or tattoo needles. °· Having intimate contact with the genitals, mouth, or rectal areas of an infected person. °· Exposure to infected fluids during birth. °WHAT ARE THE SIGNS AND SYMPTOMS OF STDs? °Different STDs have different symptoms. Some people may not have any symptoms. If symptoms are present, they may include: °· Painful or bloody urination. °· Pain in the pelvis, abdomen, vagina, anus, throat, or eyes. °· A skin rash, itching, or irritation. °· Growths, ulcerations, blisters, or sores in the genital and anal areas. °· Abnormal vaginal discharge with or without bad odor. °· Penile discharge in men. °· Fever. °· Pain or bleeding during sexual intercourse. °· Swollen glands in the groin area. °· Yellow skin and eyes (jaundice). This is seen with hepatitis. °· Swollen testicles. °· Infertility. °· Sores and blisters in the mouth. °HOW ARE STDs DIAGNOSED? °To make a diagnosis, your health care provider may: °· Take a medical history. °· Perform a physical exam. °· Take a sample of any discharge to examine. °· Swab the throat,  cervix, opening to the penis, rectum, or vagina for testing. °· Test a sample of your first morning urine. °· Perform blood tests. °· Perform a Pap test, if this applies. °· Perform a colposcopy. °· Perform a laparoscopy. °HOW ARE STDs TREATED? °Treatment depends on the STD. Some STDs may be treated but not cured. °· Chlamydia, gonorrhea, trichomonas, and syphilis can be cured with antibiotic medicine. °· Genital herpes, hepatitis, and HIV can be treated, but not cured, with prescribed medicines. The medicines lessen symptoms. °· Genital warts from HPV can be treated with medicine or by freezing, burning (electrocautery), or surgery. Warts may come back. °· HPV cannot be cured with medicine or surgery. However, abnormal areas may be removed from the cervix, vagina, or vulva. °· If your diagnosis is confirmed, your recent sexual partners need treatment. This is true even if they are symptom-free or have a negative culture or evaluation. They should not have sex until their health care providers say it is okay. °· Your health care provider may test you for infection again 3 months after treatment. °HOW CAN I REDUCE MY RISK OF GETTING AN STD? °Take these steps to reduce your risk of getting an STD: °· Use latex condoms, dental dams, and water-soluble lubricants during sexual activity. Do not use petroleum jelly or oils. °· Avoid having multiple sex partners. °· Do not have sex with someone who has other sex partners °· Do not have sex with anyone you do not know or who is at high risk for an STD. °· Avoid risky sex practices that can break your skin. °· Do not have sex   if you have open sores on your mouth or skin. °· Avoid drinking too much alcohol or taking illegal drugs. Alcohol and drugs can affect your judgment and put you in a vulnerable position. °· Avoid engaging in oral and anal sex acts. °· Get vaccinated for HPV and hepatitis. If you have not received these vaccines in the past, talk to your health care  provider about whether one or both might be right for you. °· If you are at risk of being infected with HIV, it is recommended that you take a prescription medicine daily to prevent HIV infection. This is called pre-exposure prophylaxis (PrEP). You are considered at risk if: °¨ You are a man who has sex with other men (MSM). °¨ You are a heterosexual man or woman and are sexually active with more than one partner. °¨ You take drugs by injection. °¨ You are sexually active with a partner who has HIV. °· Talk with your health care provider about whether you are at high risk of being infected with HIV. If you choose to begin PrEP, you should first be tested for HIV. You should then be tested every 3 months for as long as you are taking PrEP. °WHAT SHOULD I DO IF I THINK I HAVE AN STD? °· See your health care provider. °· Tell your sexual partner(s). They should be tested and treated for any STDs. °· Do not have sex until your health care provider says it is okay. °WHEN SHOULD I GET IMMEDIATE MEDICAL CARE? °Contact your health care provider right away if:  °· You have severe abdominal pain. °· You are a man and notice swelling or pain in your testicles. °· You are a woman and notice swelling or pain in your vagina. °  °This information is not intended to replace advice given to you by your health care provider. Make sure you discuss any questions you have with your health care provider. °  °Document Released: 07/30/2002 Document Revised: 05/30/2014 Document Reviewed: 11/27/2012 °Elsevier Interactive Patient Education ©2016 [employer] ° °

## 2015-04-15 NOTE — Progress Notes (Signed)
24 y.o.Single Caucasian G0P0 here for a follow up from last week.  She was seen for a yeast infection and was treated with Diflucan.  Due to the  copious amount of Monistat I was unable to finish testing her for STD's.  Her labs were normal.  This last partner she had a lot of pain at the introitus.  She has not been SA since then.  Now she feels better but is at times having a malodorous discharge that is white to clear.  At times their is an increase in itching. Denies new personal products or vaginal dryness.  She does use tampons a lot for discharge.  She has STD concerns since this was a new partner. Urinary symptoms at times dysuria . Contraception is Mirena IUD insertion on 10/2010.  She also has a history of abnormal pap and we had planned to do her pap today as her insurance does not cover AEX.  Mother is with her today.   O:Healthy female WDWN Affect: normal, orientation x 3  Exam: alert and cooperative Abdomen:  Soft and non tender Lymph node: no enlargement or tenderness Pelvic exam: External genital: normal female BUS: negative Vagina: thin clear to white discharge noted. Affirm taken Cervix: normal, non tender, no CMT IUD string is barely visible but is in the canal Uterus: normal, non tender Adnexa:normal, non tender, no masses or fullness noted  Labs:  GC & Chl, Affirm; pap, urine C&S   A: Normal pelvic exam  Mirena IUD for contraception  R/O STD  Vaginitis - will treat for BV   P: Discussed findings of vaginal discharge and etiology. Discussed Aveeno or baking soda sitz bath for comfort. Avoid moist clothes or pads for extended period of time. If working out in gym clothes or swim suits for long periods of time change underwear or bottoms of swimsuit if possible. Olive Oil/Coconut Oil use for skin protection prior to activity can be used to external skin. Rx:  Since Holiday will treat with Metrogel hs X 5  Follow with Diflucan 150 mg  Will call with pap and STD's  If  she continues to have odorous vaginal discharge to call may need oral antibiotic  RV prn

## 2015-04-16 LAB — URINALYSIS, MICROSCOPIC ONLY
CASTS: NONE SEEN [LPF]
Crystals: NONE SEEN [HPF]
RBC / HPF: NONE SEEN RBC/HPF (ref <=2)
YEAST: NONE SEEN [HPF]

## 2015-04-16 LAB — WET PREP BY MOLECULAR PROBE
Candida species: NEGATIVE
Gardnerella vaginalis: POSITIVE — AB
Trichomonas vaginosis: NEGATIVE

## 2015-04-17 LAB — URINE CULTURE
COLONY COUNT: NO GROWTH
Organism ID, Bacteria: NO GROWTH

## 2015-04-20 NOTE — Progress Notes (Signed)
Encounter reviewed by Dr. Brook Amundson C. Silva.  

## 2015-04-21 ENCOUNTER — Telehealth: Payer: Self-pay | Admitting: Nurse Practitioner

## 2015-04-21 LAB — IPS PAP TEST WITH HPV

## 2015-04-21 LAB — IPS N GONORRHOEA AND CHLAMYDIA BY PCR

## 2015-04-21 MED ORDER — METRONIDAZOLE 500 MG PO TABS: 500.0000 mg | ORAL_TABLET | Freq: Two times a day (BID) | ORAL | Status: DC

## 2015-04-21 NOTE — Telephone Encounter (Signed)
She may take Flagyl 500 mg BID # 14 instead of Metrogel.

## 2015-04-21 NOTE — Telephone Encounter (Signed)
Spoke with patient. Advised order for Flagyl 500 mg BID #14 0RF has been sent to her pharmacy on file. ETOH precautions given. Patient is agreeable.  Routing to provider for final review. Patient agreeable to disposition. Will close encounter.

## 2015-04-21 NOTE — Telephone Encounter (Signed)
Patient has a question regarding her prescription.  °

## 2015-04-21 NOTE — Telephone Encounter (Signed)
Spoke with patient. All results given as seen below from Kathleen Carrillo, [place]. Patient is agreeable and verbalizes understanding. Patient states that the cost for Metrogel will be $77 with her insurance. Patient is requesting oral alternative. Advised I will speak with Kathleen Boroughs, FNP in regards to medication cost and change. Will return call with further recommendations. Patient is agreeable.  Notes Recorded by Kathleen Boroughs, FNP on 04/20/2015 at 5:57 PM Please let pt. Know that Affirm was positive for BV and she has already been treated. The urine culture was negative. The GC and Chlamydia is not yet reported and will let her know when it is. Pap is pending as well.   Kathleen Boroughs, FNP okay to switch to Flagyl 500 mg bid X7 days?

## 2016-02-09 ENCOUNTER — Telehealth: Payer: Self-pay | Admitting: Nurse Practitioner

## 2016-02-09 NOTE — Telephone Encounter (Signed)
Unable to leave message

## 2016-02-09 NOTE — Telephone Encounter (Signed)
Patient would like to have her iud replaced. She also thinks she may have a yeast infection.

## 2016-03-01 ENCOUNTER — Encounter: Payer: Self-pay | Admitting: Nurse Practitioner

## 2016-03-08 NOTE — Telephone Encounter (Signed)
Attempted to reach patient x 2 there was no answer and unable to leave a voicemail. Encounter previously closed in error.   Kathleen Boroughs, FNP okay to done encounter?

## 2016-03-09 NOTE — Telephone Encounter (Signed)
Letter to Kem Boroughs, FNP for review.

## 2016-03-09 NOTE — Telephone Encounter (Signed)
Her Mirena IUD was placed 10/2010 - the IUD has ran out and she is not protected for birth control.  We need to send a letter since unable to get in touch with her to call [place] and at least make her aware of no birth control protection.

## 2016-03-10 NOTE — Telephone Encounter (Signed)
Letter mailed to patient's home address on file.  Routing to provider for final review. Encounter previously closed.

## 2016-03-17 ENCOUNTER — Telehealth: Payer: Self-pay | Admitting: Nurse Practitioner

## 2016-03-17 NOTE — Telephone Encounter (Signed)
Patient called and requested to speak with the nurse. She said, "I recently took a Plan B pill because there was a chance I might be pregnant. Now my boobs are so sore. I don't know if there's any chance I could be pregnant still or if that is just a side effect of Plan B."

## 2016-03-17 NOTE — Telephone Encounter (Addendum)
Unable to leave message

## 2016-03-17 NOTE — Telephone Encounter (Signed)
Spoke with patient. Patient states she had intercourse with boyfriend 02/25/16 at 2am; patient reports no ejaculation but took Plan B as precaution on 02/28/16 at 3pm. Patient states she has an expired Mirena IUD in place- reports due for replacement 10/2015- encouraged patient to schedule appt to have new IUD placed. LMP 02/17/16, reports cycles are usually irregular. Patient asking if breast tenderness/pain and fatigue common side effect of Plan B. Advised these are common side effects, RN advised has been over 2 weeks since Plan B taken. Patient states she has not taken UPT at this point. Recommended OV for UPT, patient declined at this time. Patient then states she had unprotected intercourse again last night and is considering taking Plan B again. Advised patient she should not take Plan B until UPT is confirmed. Patient states she on her way into purchase UPT now. Advise to call back with results. Encouraged patient to come in for OV, patient declined again due to work schedule. Patient states she has AEX 03/21/16 with Kem Boroughs, NP. Advised patient Kem Boroughs, NP out of the office today, will review with covering provider for additional recommendations and return call. Patient verbalizes understanding and is agreeable.   Melvia Heaps, CNM please advise?    Cc: Kem Boroughs, NP

## 2016-03-18 NOTE — Telephone Encounter (Signed)
Spoke with patient. Patient states she took UPT 03/17/16 in the afternoon and states was negative. Patient states she repeated 03/18/16 with first urine of the morning and was negative. Patient states she is still considering taking Plan B today and reports today would be the last day she could take it to be within 72 hrs. Patient states she may go to Urgent care to follow-up with blood pregnancy test, but not sure yet due to cost. Offered patient OV appt today, patient declined due to work schedule. Advised would review with provider and return call with any additional recommendations.   Kathleen Carrillo, CNM any additional recommendations?   Cc: Kem Boroughs, NP

## 2016-03-18 NOTE — Telephone Encounter (Signed)
Unable to leave message

## 2016-03-21 ENCOUNTER — Other Ambulatory Visit: Payer: Self-pay | Admitting: Nurse Practitioner

## 2016-03-21 ENCOUNTER — Ambulatory Visit (INDEPENDENT_AMBULATORY_CARE_PROVIDER_SITE_OTHER): Payer: PRIVATE HEALTH INSURANCE | Admitting: Nurse Practitioner

## 2016-03-21 ENCOUNTER — Encounter: Payer: Self-pay | Admitting: Nurse Practitioner

## 2016-03-21 VITALS — BP 100/64 | HR 68 | Ht 63.5 in | Wt 122.0 lb

## 2016-03-21 DIAGNOSIS — N760 Acute vaginitis: Secondary | ICD-10-CM | POA: Diagnosis not present

## 2016-03-21 DIAGNOSIS — N939 Abnormal uterine and vaginal bleeding, unspecified: Secondary | ICD-10-CM | POA: Diagnosis not present

## 2016-03-21 DIAGNOSIS — Z975 Presence of (intrauterine) contraceptive device: Secondary | ICD-10-CM

## 2016-03-21 DIAGNOSIS — Z0000 Encounter for general adult medical examination without abnormal findings: Secondary | ICD-10-CM | POA: Diagnosis not present

## 2016-03-21 DIAGNOSIS — Z01411 Encounter for gynecological examination (general) (routine) with abnormal findings: Secondary | ICD-10-CM | POA: Diagnosis not present

## 2016-03-21 DIAGNOSIS — Z113 Encounter for screening for infections with a predominantly sexual mode of transmission: Secondary | ICD-10-CM

## 2016-03-21 LAB — POCT URINALYSIS DIPSTICK
Bilirubin, UA: NEGATIVE
Blood, UA: NEGATIVE
GLUCOSE UA: NEGATIVE
KETONES UA: NEGATIVE
LEUKOCYTES UA: NEGATIVE
Nitrite, UA: NEGATIVE
PROTEIN UA: NEGATIVE
Urobilinogen, UA: NEGATIVE
pH, UA: 5

## 2016-03-21 LAB — POCT URINE PREGNANCY: PREG TEST UR: NEGATIVE

## 2016-03-21 NOTE — Progress Notes (Signed)
Patient ID: Kathleen Carrillo, female   DOB: [birth year], 25 y.o.   MRN: [id number]  25 y.o. G0P0000 Single Caucasian Fe here for annual exam.  She has Mirena IUD with insertion on 11/10/10.  She is aware that IUD has expired.  She may get occasional spotting with IUD.  She did get spotting in June.  Then SA with new partner in July using condoms.  Following SA on 10/5 with her ex boyfriend - who is quite large.  She bled for 5 days - some of which comes from taking Plan B 2 days after SA.  Then later in October on the 17 th spotted only X 1 day.   Again SA with ex boyfriend on 03/16/16. Did not take Plan B, concerned about pregnancy.  She is having some breast tenderness and unsure if related to menstrual symptoms. She continues working at a [employer], [employer].   Patient's last menstrual period was 03/08/2016 (exact date).          Sexually active: Yes.    The current method of family planning is IUD. Mirena placed 11/10/10. Exercising: Yes.  A lot of walking and squatting at work. Smoker:  no  Health Maintenance: Last pap: 04/15/15, Negative with neg HR HPV, previous abnormal pap 2013 LGSIL Gardisil: yes, completed: 2013 TDaP: ? HIV: 04/10/15 Labs: HB: 13.7  Urine: negative   UPT: negative.   reports that she has never smoked. She has never used smokeless tobacco. She reports that she drinks about 4.2 oz of alcohol per week . She reports that she does not use drugs.  Past Medical History:  Diagnosis Date  . Benign breast lumps   . IUD contraception 10/2010   mirena  . LGSIL (low grade squamous intraepithelial dysplasia) 2013    Past Surgical History:  Procedure Laterality Date  . BREAST BIOPSY Left 2012  . COLPOSCOPY      Current Outpatient Prescriptions  Medication Sig Dispense Refill  . acyclovir (ZOVIRAX) 400 MG tablet Take 1 tablet (400 mg total) by mouth 2 (two) times daily. 60 tablet 11  . levonorgestrel (MIRENA) 20 MCG/24HR IUD 1 each by Intrauterine route once.    Marland Kitchen LYSINE PO Take by  mouth daily.     No current facility-administered medications for this visit.     Family History  Problem Relation Age of Onset  . Hypertension Father   . Hypertension Mother   . Heart disease Maternal Grandfather     CABG x 3  . Diabetes Maternal Grandfather   . Cancer Cousin     skin cancer  . Cancer Other     throat    ROS:  Pertinent items are noted in HPI.  Otherwise, a comprehensive ROS was negative.  Exam:   BP 100/64 (BP Location: Right Arm, Patient Position: Sitting, Cuff Size: Normal)   Pulse 68   Ht 5' 3.5" (1.613 m)   Wt 122 lb (55.3 kg)   LMP 03/08/2016 (Exact Date) Comment: spotting only  BMI 21.27 kg/m  Height: 5' 3.5" (161.3 cm) Ht Readings from Last 3 Encounters:  03/21/16 5' 3.5" (1.613 m)  04/15/15 5' 3.75" (1.619 m)  04/10/15 5' 3.75" (1.619 m)    General appearance: alert, cooperative and appears stated age Head: Normocephalic, without obvious abnormality, atraumatic Neck: no adenopathy, supple, symmetrical, trachea midline and thyroid normal to inspection and palpation Lungs: clear to auscultation bilaterally Breasts: normal appearance, no masses or tenderness Heart: regular rate and rhythm Abdomen: soft, non-tender; no masses,  no organomegaly Extremities: extremities normal, atraumatic, no cyanosis or edema Skin: Skin color, texture, turgor normal. No rashes or lesions Lymph nodes: Cervical, supraclavicular, and axillary nodes normal. No abnormal inguinal nodes palpated Neurologic: Grossly normal   Pelvic: External genitalia:  no lesions              Urethra:  normal appearing urethra with no masses, tenderness or lesions              Bartholin's and Skene's: normal                 Vagina: normal appearing vagina with normal color and light brown tinged discharge, no lesions              Cervix: anteverted - IUD strings not visible              Pap taken: Yes.   Bimanual Exam:  Uterus:  normal size, contour, position, consistency,  mobility, non-tender              Adnexa: no mass, fullness, tenderness               Rectovaginal: Confirms               Anus:  normal sphincter tone, no lesions  Chaperone present: yes  A:  Well Woman with normal exam  Mirena IUD with strings not visible - insertion 11/10/10  History of abnormal pap 2013 - LGSIL   R/O STD's  AUB from hormonal changes vs. Infection    P:   Reviewed health and wellness pertinent to exam  Pap smear as above  Will make arrangements for IUD removal and insertion soon. - may need PUS  Discussed with Dr. Quincy Simmonds and will try to find strings before ordering PUS - pt is agreeable to plan  Will get serum HCG and follow along with labs  Counseled on breast self exam, STD prevention, HIV risk factors and prevention, adequate intake of calcium and vitamin D, diet and exercise return annually or prn  An After Visit Summary was printed and given to the patient.

## 2016-03-21 NOTE — Patient Instructions (Signed)
General topics  Next pap or exam is  due in 1 year Take a Women's multivitamin Take 1200 mg. of calcium daily - prefer dietary If any concerns in interim to call back  Breast Self-Awareness Practicing breast self-awareness may pick up problems early, prevent significant medical complications, and possibly save your life. By practicing breast self-awareness, you can become familiar with how your breasts look and feel and if your breasts are changing. This allows you to notice changes early. It can also offer you some reassurance that your breast health is good. One way to learn what is normal for your breasts and whether your breasts are changing is to do a breast self-exam. If you find a lump or something that was not present in the past, it is best to contact your caregiver right away. Other findings that should be evaluated by your caregiver include nipple discharge, especially if it is bloody; skin changes or reddening; areas where the skin seems to be pulled in (retracted); or new lumps and bumps. Breast pain is seldom associated with cancer (malignancy), but should also be evaluated by a caregiver. BREAST SELF-EXAM The best time to examine your breasts is 5 7 days after your menstrual period is over.  ExitCare Patient Information 2013 [employer].   Exercise to Stay Healthy Exercise helps you become and stay healthy. EXERCISE IDEAS AND TIPS Choose exercises that:  You enjoy.  Fit into your day. You do not need to exercise really hard to be healthy. You can do exercises at a slow or medium level and stay healthy. You can:  Stretch before and after working out.  Try yoga, Pilates, or tai chi.  Lift weights.  Walk fast, swim, jog, run, climb stairs, bicycle, dance, or rollerskate.  Take aerobic classes. Exercises that burn about 150 calories:  Running 1  miles in 15 minutes.  Playing volleyball for 45 to 60 minutes.  Washing and waxing a car for 45 to 60  minutes.  Playing touch football for 45 minutes.  Walking 1  miles in 35 minutes.  Pushing a stroller 1  miles in 30 minutes.  Playing basketball for 30 minutes.  Raking leaves for 30 minutes.  Bicycling 5 miles in 30 minutes.  Walking 2 miles in 30 minutes.  Dancing for 30 minutes.  Shoveling snow for 15 minutes.  Swimming laps for 20 minutes.  Walking up stairs for 15 minutes.  Bicycling 4 miles in 15 minutes.  Gardening for 30 to 45 minutes.  Jumping rope for 15 minutes.  Washing windows or floors for 45 to 60 minutes. Document Released: 06/11/2010 Document Revised: 08/01/2011 Document Reviewed: 06/11/2010 ExitCare Patient Information 2013 ExitCare[employer].   Other topics ( that may be useful information):    Sexually Transmitted Disease Sexually transmitted disease (STD) refers to any infection that is passed from person to person during sexual activity. This may happen by way of saliva, semen, blood, vaginal mucus, or urine. Common STDs include:  Gonorrhea.  Chlamydia.  Syphilis.  HIV/AIDS.  Genital herpes.  Hepatitis B and C.  Trichomonas.  Human papillomavirus (HPV).  Pubic lice. CAUSES  An STD may be spread by bacteria, virus, or parasite. A person can get an STD by:  Sexual intercourse with an infected person.  Sharing sex toys with an infected person.  Sharing needles with an infected person.  Having intimate contact with the genitals, mouth, or rectal areas of an infected person. SYMPTOMS  Some people may not have any symptoms, but   they can still pass the infection to others. Different STDs have different symptoms. Symptoms include:  Painful or bloody urination.  Pain in the pelvis, abdomen, vagina, anus, throat, or eyes.  Skin rash, itching, irritation, growths, or sores (lesions). These usually occur in the genital or anal area.  Abnormal vaginal discharge.  Penile discharge in men.  Soft, flesh-colored skin growths in the  genital or anal area.  Fever.  Pain or bleeding during sexual intercourse.  Swollen glands in the groin area.  Yellow skin and eyes (jaundice). This is seen with hepatitis. DIAGNOSIS  To make a diagnosis, your caregiver may:  Take a medical history.  Perform a physical exam.  Take a specimen (culture) to be examined.  Examine a sample of discharge under a microscope.  Perform blood test TREATMENT   Chlamydia, gonorrhea, trichomonas, and syphilis can be cured with antibiotic medicine.  Genital herpes, hepatitis, and HIV can be treated, but not cured, with prescribed medicines. The medicines will lessen the symptoms.  Genital warts from HPV can be treated with medicine or by freezing, burning (electrocautery), or surgery. Warts may come back.  HPV is a virus and cannot be cured with medicine or surgery.However, abnormal areas may be followed very closely by your caregiver and may be removed from the cervix, vagina, or vulva through office procedures or surgery. If your diagnosis is confirmed, your recent sexual partners need treatment. This is true even if they are symptom-free or have a negative culture or evaluation. They should not have sex until their caregiver says it is okay. HOME CARE INSTRUCTIONS  All sexual partners should be informed, tested, and treated for all STDs.  Take your antibiotics as directed. Finish them even if you start to feel better.  Only take over-the-counter or prescription medicines for pain, discomfort, or fever as directed by your caregiver.  Rest.  Eat a balanced diet and drink enough fluids to keep your urine clear or pale yellow.  Do not have sex until treatment is completed and you have followed up with your caregiver. STDs should be checked after treatment.  Keep all follow-up appointments, Pap tests, and blood tests as directed by your caregiver.  Only use latex condoms and water-soluble lubricants during sexual activity. Do not use  petroleum jelly or oils.  Avoid alcohol and illegal drugs.  Get vaccinated for HPV and hepatitis. If you have not received these vaccines in the past, talk to your caregiver about whether one or both might be right for you.  Avoid risky sex practices that can break the skin. The only way to avoid getting an STD is to avoid all sexual activity.Latex condoms and dental dams (for oral sex) will help lessen the risk of getting an STD, but will not completely eliminate the risk. SEEK MEDICAL CARE IF:   You have a fever.  You have any new or worsening symptoms. Document Released: 07/30/2002 Document Revised: 08/01/2011 Document Reviewed: 08/06/2010 [facility] Patient Information 2013 [place].    Domestic Abuse You are being battered or abused if someone close to you hits, pushes, or physically hurts you in any way. You also are being abused if you are forced into activities. You are being sexually abused if you are forced to have sexual contact of any kind. You are being emotionally abused if you are made to feel worthless or if you are constantly threatened. It is important to remember that help is available. No one has the right to abuse you. PREVENTION OF FURTHER  ABUSE  Learn the warning signs of danger. This varies with situations but may include: the use of alcohol, threats, isolation from friends and family, or forced sexual contact. Leave if you feel that violence is going to occur.  If you are attacked or beaten, report it to the police so the abuse is documented. You do not have to press charges. The police can protect you while you or the attackers are leaving. Get the officer's name and badge number and a copy of the report.  Find someone you can trust and tell them what is happening to you: your caregiver, a nurse, clergy member, close friend or family member. Feeling ashamed is natural, but remember that you have done nothing wrong. No one deserves abuse. Document Released:  05/06/2000 Document Revised: 08/01/2011 Document Reviewed: 07/15/2010 ExitCare Patient Information 2013 [employer]Care[employer].    How Much is Too Much Alcohol? Drinking too much alcohol can cause injury, accidents, and health problems. These types of problems can include:   Car crashes.  Falls.  Family fighting (domestic violence).  Drowning.  Fights.  Injuries.  Burns.  Damage to certain organs.  Having a baby with birth defects. ONE DRINK CAN BE TOO MUCH WHEN YOU ARE:  Working.  Pregnant or breastfeeding.  Taking medicines. Ask your doctor.  Driving or planning to drive. If you or someone you know has a drinking problem, get help from a doctor.  Document Released: 03/05/2009 Document Revised: 08/01/2011 Document Reviewed: 03/05/2009 ExitCare Patient Information 2013 ExitCare[employer].   Smoking Hazards Smoking cigarettes is extremely bad for your health. Tobacco smoke has over 200 known poisons in it. There are over 60 chemicals in tobacco smoke that cause cancer. Some of the chemicals found in cigarette smoke include:   Cyanide.  Benzene.  Formaldehyde.  Methanol (wood alcohol).  Acetylene (fuel used in welding torches).  Ammonia. Cigarette smoke also contains the poisonous gases nitrogen oxide and carbon monoxide.  Cigarette smokers have an increased risk of many serious medical problems and Smoking causes approximately:  90% of all lung cancer deaths in men.  80% of all lung cancer deaths in women.  90% of deaths from chronic obstructive lung disease. Compared with nonsmokers, smoking increases the risk of:  Coronary heart disease by 2 to 4 times.  Stroke by 2 to 4 times.  Men developing lung cancer by 23 times.  Women developing lung cancer by 13 times.  Dying from chronic obstructive lung diseases by 12 times.  . Smoking is the most preventable cause of death and disease in our society.  WHY IS SMOKING ADDICTIVE?  Nicotine is the chemical  agent in tobacco that is capable of causing addiction or dependence.  When you smoke and inhale, nicotine is absorbed rapidly into the bloodstream through your lungs. Nicotine absorbed through the lungs is capable of creating a powerful addiction. Both inhaled and non-inhaled nicotine may be addictive.  Addiction studies of cigarettes and spit tobacco show that addiction to nicotine occurs mainly during the teen years, when young people begin using tobacco products. WHAT ARE THE BENEFITS OF QUITTING?  There are many health benefits to quitting smoking.   Likelihood of developing cancer and heart disease decreases. Health improvements are seen almost immediately.  Blood pressure, pulse rate, and breathing patterns start returning to normal soon after quitting. QUITTING SMOKING   American Lung Association - 1-800-LUNGUSA  American Cancer Society - 1-800-ACS-2345 Document Released: 06/16/2004 Document Revised: 08/01/2011 Document Reviewed: 02/18/2009 ExitCare Patient Information 2013 ExitCare,   LLC.   Stress Management Stress is a state of physical or mental tension that often results from changes in your life or normal routine. Some common causes of stress are:  Death of a loved one.  Injuries or severe illnesses.  Getting fired or changing jobs.  Moving into a new home. Other causes may be:  Sexual problems.  Business or financial losses.  Taking on a large debt.  Regular conflict with someone at home or at work.  Constant tiredness from lack of sleep. It is not just bad things that are stressful. It may be stressful to:  Win the lottery.  Get married.  Buy a new car. The amount of stress that can be easily tolerated varies from person to person. Changes generally cause stress, regardless of the types of change. Too much stress can affect your health. It may lead to physical or emotional problems. Too little stress (boredom) may also become stressful. SUGGESTIONS TO  REDUCE STRESS:  Talk things over with your family and friends. It often is helpful to share your concerns and worries. If you feel your problem is serious, you may want to get help from a professional counselor.  Consider your problems one at a time instead of lumping them all together. Trying to take care of everything at once may seem impossible. List all the things you need to do and then start with the most important one. Set a goal to accomplish 2 or 3 things each day. If you expect to do too many in a single day you will naturally fail, causing you to feel even more stressed.  Do not use alcohol or drugs to relieve stress. Although you may feel better for a short time, they do not remove the problems that caused the stress. They can also be habit forming.  Exercise regularly - at least 3 times per week. Physical exercise can help to relieve that "uptight" feeling and will relax you.  The shortest distance between despair and hope is often a good night's sleep.  Go to bed and get up on time allowing yourself time for appointments without being rushed.  Take a short "time-out" period from any stressful situation that occurs during the day. Close your eyes and take some deep breaths. Starting with the muscles in your face, tense them, hold it for a few seconds, then relax. Repeat this with the muscles in your neck, shoulders, hand, stomach, back and legs.  Take good care of yourself. Eat a balanced diet and get plenty of rest.  Schedule time for having fun. Take a break from your daily routine to relax. HOME CARE INSTRUCTIONS   Call if you feel overwhelmed by your problems and feel you can no longer manage them on your own.  Return immediately if you feel like hurting yourself or someone else. Document Released: 11/02/2000 Document Revised: 08/01/2011 Document Reviewed: 06/25/2007 ExitCare Patient Information 2013 ExitCare, LLC.  

## 2016-03-22 ENCOUNTER — Other Ambulatory Visit: Payer: Self-pay | Admitting: Nurse Practitioner

## 2016-03-22 LAB — WET PREP BY MOLECULAR PROBE
Candida species: POSITIVE — AB
GARDNERELLA VAGINALIS: POSITIVE — AB
TRICHOMONAS VAG: NEGATIVE

## 2016-03-22 LAB — HCG, SERUM, QUALITATIVE: PREG SERUM: NEGATIVE

## 2016-03-22 LAB — GC/CHLAMYDIA PROBE AMP
CT PROBE, AMP APTIMA: NOT DETECTED
GC Probe RNA: NOT DETECTED

## 2016-03-22 LAB — STD PANEL
HEP B S AG: NEGATIVE
HIV 1&2 Ab, 4th Generation: NONREACTIVE

## 2016-03-22 MED ORDER — FLUCONAZOLE 150 MG PO TABS: 150.0000 mg | ORAL_TABLET | Freq: Once | ORAL | 0 refills | Status: AC

## 2016-03-22 MED ORDER — METRONIDAZOLE 0.75 % VA GEL: 1.0000 | Freq: Every day | VAGINAL | 0 refills | Status: DC

## 2016-03-22 NOTE — Telephone Encounter (Signed)
OK to close encounter? Patient seen in office 03/21/16.  Cc: Kathleen Carrillo, CNM

## 2016-03-22 NOTE — Progress Notes (Signed)
Encounter reviewed by Dr. Aundria Rud. Plan for IUD removal and reinsertion on the same day.  Had pelvic ultrasound last year on 10/02/14 which confirmed the intrauterine position of the IUD.

## 2016-03-22 NOTE — Progress Notes (Signed)
Kim from [facility] to report negative pregnancy test. Notified Kem Boroughs, NP At 12:45pm on 03/22/16.

## 2016-03-22 NOTE — Telephone Encounter (Signed)
With negative UPT she could do Plan B, but if she is early pregnancy would not show at this point

## 2016-03-22 NOTE — Telephone Encounter (Signed)
I think recommendations are complete

## 2016-03-23 NOTE — Telephone Encounter (Signed)
Kem Boroughs, NP ok to close encounter?

## 2016-03-23 NOTE — Telephone Encounter (Signed)
OK to close

## 2016-03-24 ENCOUNTER — Telehealth: Payer: Self-pay | Admitting: Nurse Practitioner

## 2016-03-24 LAB — IPS PAP TEST WITH REFLEX TO HPV

## 2016-03-24 MED ORDER — METRONIDAZOLE 500 MG PO TABS: 500.0000 mg | ORAL_TABLET | Freq: Two times a day (BID) | ORAL | 0 refills | Status: DC

## 2016-03-24 NOTE — Telephone Encounter (Signed)
Spoke with patient. Patient states she had intercourse last night and states partner did not ejaculate in vagina, but patient states she was planning to take Plan B "to be safe". Advised patient per Melvia Heaps, CNM no flagyl if concerns of pregnancy. Recommended IUD replacement as recommended at 03/21/16 OV. Advised patient would review with Melvia Heaps, CNM and return call. No new orders placed.  Melvia Heaps, CNM   CC: Kem Boroughs, NP

## 2016-03-24 NOTE — Telephone Encounter (Signed)
Usually two weeks

## 2016-03-24 NOTE — Telephone Encounter (Signed)
She could do Flagyl generic if no concerns with pregnancy

## 2016-03-24 NOTE — Telephone Encounter (Signed)
She can do Flagyl once period starts

## 2016-03-24 NOTE — Telephone Encounter (Signed)
Spoke with patient. Patient states she went to pick up metrogel at pharmacy and was too expensive -$90. Patient states she would like alternative if possible. Advised Kem Boroughs, NP out of the office today, will review with covering provider for recommendations and return call. Patient is agreeable.   Melvia Heaps, CNM -please advise?  Cc: Kem Boroughs, NP

## 2016-03-24 NOTE — Telephone Encounter (Signed)
patient wants to see about getting an rx changes.  She said she went to pharmacy to pick it up and it was $90.

## 2016-03-24 NOTE — Telephone Encounter (Signed)
Spoke with patient advised as seen below. New order placed. Patient verbalizes understanding and is agreeable. Patient then ask how long after taking plan B should she wait to take UPT? Advised I will review with provider and return call.   Melvia Heaps, CNM

## 2016-03-25 NOTE — Telephone Encounter (Signed)
Spoke with patient, advised as seen below. Patient verbalizes understanding and is agreeable.  Routing to provider for final review. Patient is agreeable to disposition. Will close encounter.   Cc: Kem Boroughs, NP

## 2016-03-30 ENCOUNTER — Telehealth: Payer: Self-pay | Admitting: Nurse Practitioner

## 2016-03-30 NOTE — Telephone Encounter (Signed)
Spoke with patient. Patient states that she thinks she had a missed call from our office this morning, but she does not have her voicemail set up. Reviewed patient's chart. Patient has been notified of all results. Confirmed with patient she has received results from 03/21/2016. Patient does not have any upcoming appointment with our office. Patient's IUD removal and reinsertion are in process of precert. Patient states that she would like to have this performed in January 2018 due to insurance coverage. Advised I will notify our insurance and billing department. Patient is agreeable. She will return call with any further questions or concerns.  Cc: Lerry Liner  Routing to covering provider for final review. Patient agreeable to disposition. Will close encounter.

## 2016-03-30 NOTE — Telephone Encounter (Signed)
Patient states she had a missed call on her phone form our office, but she does not have a voicemail setup and therefore is no message.Patient seems to think this may be a call in reference to appointment on 03/21/16. Advised patient I will forward to our triage team to review. Patient is agreeable.  Routing to triage for review

## 2016-04-06 ENCOUNTER — Telehealth: Payer: Self-pay | Admitting: *Deleted

## 2016-04-06 NOTE — Telephone Encounter (Signed)
Call to patient regarding IUD replacement. Calling to discuss alternatives due to cost issues.  Patient still desires IUD replacement if can find cost effective option. Really would like to avoid birth control pills if possible. Feels like Mirena controls bleeding and hormones. Advised will need to review options with physician and pharmacy and call patient back.

## 2016-07-01 ENCOUNTER — Telehealth: Payer: Self-pay | Admitting: Nurse Practitioner

## 2016-07-01 NOTE — Telephone Encounter (Signed)
Encounter closed in error. Attempted to reach patient at number provided, [phone]. There was no answer and recording states that "the person you have reached is not available at this time please try again later."

## 2016-07-01 NOTE — Telephone Encounter (Signed)
Patient called to discuss IUD with nurse. States she currently has mirena that expired and she is interested in discussing type that lasts 3 years. Looked into having IUD replaced at the end of 2017 but unable due to insurance reasons. Patient requests device recommendation and updated benefits prior to deciding.   Patient agreeable to return call from triage to discuss.

## 2016-07-05 ENCOUNTER — Telehealth: Payer: Self-pay | Admitting: Nurse Practitioner

## 2016-07-05 NOTE — Telephone Encounter (Signed)
Spoke with patient. Patient states she sometimes gets bacterial infections and is not sure that the one she was treated for in November is gone or cleared. Patient sates she has always had a vaginal discharge and is experiencing vaginal tenderness and soreness. Patient denies itching or urinary complaints, pain, fever. Patient reports LMP second week of January. Patient states she was having difficulty inserting tampons and believed that she possibly had a retained tampon. Patient states she was able to feel around with finger, squatted and laid down, but reports she found no tampon. Patient states she would like to know if flagyl she was prescribed in November worked to clear her bacterial infection? Patient states no change in partner since last OV. Recommended OV for further evaluation. Patient states she can not come in until next week because she lives in [place] and will need to come same day as her previously scheduled dentist appointment. Advised patient not to wait until next week if concern for retained tampon, patient states she could not feel anything or nothing came out. Patient scheduled for 07/13/16 at 12:45pm. Advised patient should symptoms change -fever, increase in vaginal discharge with odor, changes in color of discharge, pain in abdomen or swelling -return call to office for scheduling, or seek care at local ER/Urgent care. Patient verbalizes understanding and is agreeable.   Routing to provider for final review. Patient is agreeable to disposition. Will close encounter.

## 2016-07-05 NOTE — Telephone Encounter (Signed)
Spoke with patient. Patient states that she is interested in having her IUD removed and replaced. Benefits were checked in 2017 for replacement with Mirena IUD and this was not covered. Patient would like to have benefits checked for this year for IUD removal and replacement with Mirena or [place] IUD. Advised RN will send a message to insurance and billing to have benefits verified and she will be contacted. Patient is agreeable.  Routing to [place] for benefits.

## 2016-07-05 NOTE — Telephone Encounter (Signed)
Patient would like to schedule an appointment for yeast infection follow up and possible retained tampon. Would like to come in on Feb 21st after being offered earlier times.

## 2016-07-12 ENCOUNTER — Telehealth: Payer: Self-pay | Admitting: Nurse Practitioner

## 2016-07-12 NOTE — Progress Notes (Signed)
Patient ID: Kathleen Carrillo, female   DOB: 1990-08-08, 26 y.o.   MRN: [id number]  26 y.o. Single Caucasian female [id number] here with complaint of vaginal symptoms of vaginal pain with insertion of tampon about 07/07/16.  Thought it may be a retained tampon and checked her self and did not find one.  Then next few days started a cycle. During that cycle was able to put in a tampon without a problem.  Denies new personal products or vaginal dryness.  She did not have a vaginal discharge initially but has one that is burning at times. Ended previous relationship in October after she was last here.  She has been SA with a previous partner one time.  ? STD concerns.  Urinary symptoms none . Contraception is condoms - some of the time.  She still has Mirena IUD in place that was put in 11/10/2010.  We had made apt for IUD replacement - but insurance did not cover.   O:  Healthy female WDWN Affect: normal, orientation x 3  Exam: no distress Abdomen: soft and non tender, no flank pain Lymph node: no enlargement or tenderness Pelvic exam: External genital: normal female BUS: negative Vagina: yellow stringy discharge noted.  Affirm taken along wit GC/ Chl. Cervix: normal, non tender, no CMT.  As noted earlier the IUD strings were not seen - they had been cut very short. Uterus: normal, non tender Adnexa:normal, non tender, no masses or fullness noted    A: R/O Vaginitis  R/O STD  Mirena IUD - past due for removal   P: Discussed findings of vaginitis and etiology. Discussed Aveeno or baking soda sitz bath for comfort. Avoid moist clothes or pads for extended period of time. If working out in gym clothes or swim suits for long periods of time change underwear or bottoms of swimsuit if possible. Olive Oil/Coconut Oil use for skin protection prior to activity can be used to external skin.  Rx: no medication treatment at this time  Follow with Affirm, GC and Chlamydia, STD panel  Have discussed with Dr. Quincy Simmonds  and she is willing to take out IUD while pt is here - she lives in [place] and hard to get back in.  She has agreed to see pt:  See note by Dr. Quincy Simmonds for removal.  RV prn

## 2016-07-13 ENCOUNTER — Ambulatory Visit (INDEPENDENT_AMBULATORY_CARE_PROVIDER_SITE_OTHER): Payer: PRIVATE HEALTH INSURANCE | Admitting: Nurse Practitioner

## 2016-07-13 ENCOUNTER — Ambulatory Visit (INDEPENDENT_AMBULATORY_CARE_PROVIDER_SITE_OTHER): Payer: PRIVATE HEALTH INSURANCE | Admitting: Obstetrics and Gynecology

## 2016-07-13 ENCOUNTER — Encounter: Payer: Self-pay | Admitting: Nurse Practitioner

## 2016-07-13 ENCOUNTER — Encounter: Payer: Self-pay | Admitting: Obstetrics and Gynecology

## 2016-07-13 VITALS — BP 110/70 | HR 80 | Ht 63.5 in | Wt 119.0 lb

## 2016-07-13 DIAGNOSIS — B009 Herpesviral infection, unspecified: Secondary | ICD-10-CM

## 2016-07-13 DIAGNOSIS — N760 Acute vaginitis: Secondary | ICD-10-CM | POA: Diagnosis not present

## 2016-07-13 DIAGNOSIS — Z01812 Encounter for preprocedural laboratory examination: Secondary | ICD-10-CM | POA: Diagnosis not present

## 2016-07-13 DIAGNOSIS — Z3009 Encounter for other general counseling and advice on contraception: Secondary | ICD-10-CM | POA: Diagnosis not present

## 2016-07-13 DIAGNOSIS — Z30432 Encounter for removal of intrauterine contraceptive device: Secondary | ICD-10-CM | POA: Diagnosis not present

## 2016-07-13 DIAGNOSIS — Z113 Encounter for screening for infections with a predominantly sexual mode of transmission: Secondary | ICD-10-CM | POA: Diagnosis not present

## 2016-07-13 LAB — POCT URINE PREGNANCY: PREG TEST UR: NEGATIVE

## 2016-07-13 MED ORDER — ACYCLOVIR 400 MG PO TABS: 400.0000 mg | ORAL_TABLET | Freq: Two times a day (BID) | ORAL | 5 refills | Status: DC

## 2016-07-13 NOTE — Patient Instructions (Signed)
Sexually Transmitted Disease A sexually transmitted disease (STD) is a disease or infection often passed to another person during sex. However, STDs can be passed through nonsexual ways. An STD can be passed through:  Spit (saliva).  Semen.  Blood.  Mucus from the vagina.  Pee (urine). How can I lessen my chances of getting an STD?  Use:  Latex condoms.  Water-soluble lubricants with condoms. Do not use petroleum jelly or oils.  Dental dams. These are small pieces of latex that are used as a barrier during oral sex.  Avoid having more than one sex partner.  Do not have sex with someone who has other sex partners.  Do not have sex with anyone you do not know or who is at high risk for an STD.  Avoid risky sex that can break your skin.  Do not have sex if you have open sores on your mouth or skin.  Avoid drinking too much alcohol or taking illegal drugs. Alcohol and drugs can affect your good judgment.  Avoid oral and anal sex acts.  Get shots (vaccines) for HPV and hepatitis.  If you are at risk of being infected with HIV, it is advised that you take a certain medicine daily to prevent HIV infection. This is called pre-exposure prophylaxis (PrEP). You may be at risk if:  You are a man who has sex with other men (MSM).  You are attracted to the opposite sex (heterosexual) and are having sex with more than one partner.  You take drugs with a needle.  You have sex with someone who has HIV.  Talk with your doctor about if you are at high risk of being infected with HIV. If you begin to take PrEP, get tested for HIV first. Get tested every 3 months for as long as you are taking PrEP.  Get tested for STDs every year if you are sexually active. If you are treated for an STD, get tested again 3 months after you are treated. What should I do if I think I have an STD?  See your doctor.  Tell your sex partner(s) that you have an STD. They should be tested and treated.  Do  not have sex until your doctor says it is okay. When should I get help? Get help right away if:  You have bad belly (abdominal) pain.  You are a man and have puffiness (swelling) or pain in your testicles.  You are a woman and have puffiness in your vagina. This information is not intended to replace advice given to you by your health care provider. Make sure you discuss any questions you have with your health care provider. Document Released: 06/16/2004 Document Revised: 10/15/2015 Document Reviewed: 11/02/2012 Elsevier Interactive Patient Education  2017 [employer] Inc.  

## 2016-07-13 NOTE — Patient Instructions (Signed)
Consider the NuvaRing or Ortho Evra birth control patch for contraception.  Contraception Choices Contraception (birth control) is the use of any methods or devices to prevent pregnancy. Below are some methods to help avoid pregnancy. Hormonal methods  Contraceptive implant. This is a thin, plastic tube containing progesterone hormone. It does not contain estrogen hormone. Your health care provider inserts the tube in the inner part of the upper arm. The tube can remain in place for up to 3 years. After 3 years, the implant must be removed. The implant prevents the ovaries from releasing an egg (ovulation), thickens the cervical mucus to prevent sperm from entering the uterus, and thins the lining of the inside of the uterus.  Progesterone-only injections. These injections are given every 3 months by your health care provider to prevent pregnancy. This synthetic progesterone hormone stops the ovaries from releasing eggs. It also thickens cervical mucus and changes the uterine lining. This makes it harder for sperm to survive in the uterus.  Birth control pills. These pills contain estrogen and progesterone hormone. They work by preventing the ovaries from releasing eggs (ovulation). They also cause the cervical mucus to thicken, preventing the sperm from entering the uterus. Birth control pills are prescribed by a health care provider.Birth control pills can also be used to treat heavy periods.  Minipill. This type of birth control pill contains only the progesterone hormone. They are taken every day of each month and must be prescribed by your health care provider.  Birth control patch. The patch contains hormones similar to those in birth control pills. It must be changed once a week and is prescribed by a health care provider.  Vaginal ring. The ring contains hormones similar to those in birth control pills. It is left in the vagina for 3 weeks, removed for 1 week, and then a new one is put back  in place. The patient must be comfortable inserting and removing the ring from the vagina.A health care provider's prescription is necessary.  Emergency contraception. Emergency contraceptives prevent pregnancy after unprotected sexual intercourse. This pill can be taken right after sex or up to 5 days after unprotected sex. It is most effective the sooner you take the pills after having sexual intercourse. Most emergency contraceptive pills are available without a prescription. Check with your pharmacist. Do not use emergency contraception as your only form of birth control. Barrier methods  Female condom. This is a thin sheath (latex or rubber) that is worn over the penis during sexual intercourse. It can be used with spermicide to increase effectiveness.  Female condom. This is a soft, loose-fitting sheath that is put into the vagina before sexual intercourse.  Diaphragm. This is a soft, latex, dome-shaped barrier that must be fitted by a health care provider. It is inserted into the vagina, along with a spermicidal jelly. It is inserted before intercourse. The diaphragm should be left in the vagina for 6 to 8 hours after intercourse.  Cervical cap. This is a round, soft, latex or plastic cup that fits over the cervix and must be fitted by a health care provider. The cap can be left in place for up to 48 hours after intercourse.  Sponge. This is a soft, circular piece of polyurethane foam. The sponge has spermicide in it. It is inserted into the vagina after wetting it and before sexual intercourse.  Spermicides. These are chemicals that kill or block sperm from entering the cervix and uterus. They come in the form of creams,  jellies, suppositories, foam, or tablets. They do not require a prescription. They are inserted into the vagina with an applicator before having sexual intercourse. The process must be repeated every time you have sexual intercourse. Intrauterine contraception  Intrauterine  device (IUD). This is a T-shaped device that is put in a woman's uterus during a menstrual period to prevent pregnancy. There are 2 types:  Copper IUD. This type of IUD is wrapped in copper wire and is placed inside the uterus. Copper makes the uterus and fallopian tubes produce a fluid that kills sperm. It can stay in place for 10 years.  Hormone IUD. This type of IUD contains the hormone progestin (synthetic progesterone). The hormone thickens the cervical mucus and prevents sperm from entering the uterus, and it also thins the uterine lining to prevent implantation of a fertilized egg. The hormone can weaken or kill the sperm that get into the uterus. It can stay in place for 3-5 years, depending on which type of IUD is used. Permanent methods of contraception  Female tubal ligation. This is when the woman's fallopian tubes are surgically sealed, tied, or blocked to prevent the egg from traveling to the uterus.  Hysteroscopic sterilization. This involves placing a small coil or insert into each fallopian tube. Your doctor uses a technique called hysteroscopy to do the procedure. The device causes scar tissue to form. This results in permanent blockage of the fallopian tubes, so the sperm cannot fertilize the egg. It takes about 3 months after the procedure for the tubes to become blocked. You must use another form of birth control for these 3 months.  Female sterilization. This is when the female has the tubes that carry sperm tied off (vasectomy).This blocks sperm from entering the vagina during sexual intercourse. After the procedure, the man can still ejaculate fluid (semen). Natural planning methods  Natural family planning. This is not having sexual intercourse or using a barrier method (condom, diaphragm, cervical cap) on days the woman could become pregnant.  Calendar method. This is keeping track of the length of each menstrual cycle and identifying when you are fertile.  Ovulation method.  This is avoiding sexual intercourse during ovulation.  Symptothermal method. This is avoiding sexual intercourse during ovulation, using a thermometer and ovulation symptoms.  Post-ovulation method. This is timing sexual intercourse after you have ovulated. Regardless of which type or method of contraception you choose, it is important that you use condoms to protect against the transmission of sexually transmitted infections (STIs). Talk with your health care provider about which form of contraception is most appropriate for you. This information is not intended to replace advice given to you by your health care provider. Make sure you discuss any questions you have with your health care provider. Document Released: 05/09/2005 Document Revised: 10/15/2015 Document Reviewed: 11/01/2012 Elsevier Interactive Patient Education  2017 [employer].

## 2016-07-13 NOTE — Progress Notes (Signed)
GYNECOLOGY  VISIT   HPI: 26 y.o.   Single  Caucasian  female   G0P0000 with Patient's last menstrual period was 07/09/2016 (approximate).   here for Mirena IUD removal, which expired 10/2015.  Was placed originally due to emotional changes. Is considering a 3 year IUD but has some financial concerns about IUDs.  Works 77 hours per week.  Difficulty to remember a pill.  Never took OCPs in the past.   Menses started again one year ago.   Menses prior to IUD lasted 5 -7 days and were not particularly heavy.   Had prior IUD to confirm position of the IUD.   Wants refill of Acyclovir.  Has HSV I and II.   UPT: Negative  GYNECOLOGIC HISTORY: Patient's last menstrual period was 07/09/2016 (approximate). Contraception:  Condoms Menopausal hormone therapy:  n/a Last mammogram:  n/a Last pap smear:  03-21-16 Neg; 04-15-15 Neg:Neg HR HPV         OB History    Gravida Para Term Preterm AB Living   0 0 0 0 0 0   SAB TAB Ectopic Multiple Live Births   0 0 0 0 0         Patient Active Problem List   Diagnosis Date Noted  . 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 LGSIL (low grade squamous intraepithelial dysplasia)     Past Medical History:  Diagnosis Date  . Benign breast lumps   . IUD contraception 10/2010   mirena  . LGSIL (low grade squamous intraepithelial dysplasia) 2013    Past Surgical History:  Procedure Laterality Date  . BREAST BIOPSY Left 2012  . COLPOSCOPY      Current Outpatient Prescriptions  Medication Sig Dispense Refill  . acyclovir (ZOVIRAX) 400 MG tablet Take 1 tablet (400 mg total) by mouth 2 (two) times daily. 60 tablet 5  . levonorgestrel (MIRENA) 20 MCG/24HR IUD 1 each by Intrauterine route once.    Marland Kitchen LYSINE PO Take by mouth daily.     No current facility-administered medications for this visit.      ALLERGIES: Sudafed [pseudoephedrine hcl] and Dayquil [pseudoephedrine-[id number]]  Family History  Problem Relation  Age of Onset  . Hypertension Father   . Hypertension Mother   . Heart disease Maternal Grandfather     CABG x 3  . Diabetes Maternal Grandfather   . Cancer Cousin     skin cancer  . Cancer Other     throat    Social History   Social History  . Marital status: Single    Spouse name: N/A  . Number of children: N/A  . Years of education: N/A   Occupational History  . Not on file.   Social History Main Topics  . Smoking status: Never Smoker  . Smokeless tobacco: Never Used  . Alcohol use 4.2 oz/week    7 Standard drinks or equivalent per week     Comment: occasional  . Drug use: No  . Sexual activity: Yes    Partners: Male    Birth control/ protection: IUD     Comment: Mirena inserted 11-10-10   Other Topics Concern  . Not on file   Social History Narrative  . No narrative on file    ROS:  Pertinent items are noted in HPI.  PHYSICAL EXAMINATION:    LMP 07/09/2016 (Approximate)     General appearance: alert, cooperative and appears stated age  IUD removal - Consent for procedure. Sterile prep  of the cervix with Hibiclens.  Speculum placed:              External genitalia:  no lesions              Urethra:  normal appearing urethra with no masses, tenderness or lesions              Bartholins and Skenes: normal                 Vagina: normal appearing vagina with normal color and discharge, no lesions              Cervix: no lesions.  IUD strings not seen. Dressing forceps and IUD grasper used to remove the IUD intact, which was shown to pt and then discarded. Bimanual Exam:  Uterus:  normal size, contour, position, consistency, mobility, non-tender              Adnexa: no mass, fullness, tenderness   No complications with IUD removal.  Chaperone was present for exam.  ASSESSMENT  Expired Mirena IUD removed. Need for reliable contraception.   PLAN  I discussed IUDs, NuvaRing, Ortho Evra.  She knows to prevent pregnancy immediately with condoms.  She  may return to discuss contraception further.  Refill of Acyclovir today.    An After Visit Summary was printed and given to the patient.  __15____ minutes face to face time of which over 50% was spent in counseling.

## 2016-07-14 LAB — GC/CHLAMYDIA PROBE AMP
CT Probe RNA: NOT DETECTED
GC Probe RNA: NOT DETECTED

## 2016-07-14 LAB — STD PANEL
HIV: NONREACTIVE
Hepatitis B Surface Ag: NEGATIVE

## 2016-07-14 LAB — WET PREP BY MOLECULAR PROBE
CANDIDA SPECIES: NEGATIVE
Gardnerella vaginalis: NEGATIVE
Trichomonas vaginosis: NEGATIVE

## 2016-07-14 NOTE — Progress Notes (Signed)
Encounter reviewed by Dr. Brook Amundson C. Silva.  

## 2016-07-28 NOTE — Telephone Encounter (Signed)
error 

## 2016-08-04 NOTE — Telephone Encounter (Signed)
See next phone encounter. Encounter closed.   

## 2017-01-20 ENCOUNTER — Encounter: Payer: Self-pay | Admitting: Obstetrics and Gynecology

## 2017-01-24 ENCOUNTER — Telehealth: Payer: Self-pay | Admitting: Certified Nurse Midwife

## 2017-01-24 NOTE — Telephone Encounter (Signed)
Return call to patient, unable to leave message.

## 2017-01-24 NOTE — Telephone Encounter (Signed)
Patient called and scheduled an appointment with Melvia Heaps, CNM on Wednesday, 01/25/17 for a consultation about concerns with her menstrual cycle. She'd like a call back from the nurse today as well. OTC pregnancy test negative.  Last seen: 07/13/16

## 2017-01-24 NOTE — Telephone Encounter (Signed)
Spoke with patient. Patient states her menses started today, concerned with cycle being late. LMP 12/28/16, "reports as short".  Experiencing nausea, was initially concerned she may be pregnant, now that cycle has started, feels may be r/t "UTI". Reports burning with urination for the last 2 weeks, delayed appointment d/t work schedule. Denies fever/chills, lower back pain. Recommended patient keep OV as scheduled for 9/5 with Melvia Heaps, CNM for further evaluation. Patient verbalizes understanding and is agreeable.  Routing to provider for final review. Patient is agreeable to disposition. Will close encounter.

## 2017-01-25 ENCOUNTER — Ambulatory Visit (INDEPENDENT_AMBULATORY_CARE_PROVIDER_SITE_OTHER): Payer: PRIVATE HEALTH INSURANCE | Admitting: Certified Nurse Midwife

## 2017-01-25 ENCOUNTER — Encounter: Payer: Self-pay | Admitting: Certified Nurse Midwife

## 2017-01-25 VITALS — BP 100/64 | HR 60 | Temp 98.4°F | Resp 16 | Ht 63.5 in | Wt 120.0 lb

## 2017-01-25 DIAGNOSIS — R319 Hematuria, unspecified: Secondary | ICD-10-CM

## 2017-01-25 DIAGNOSIS — N390 Urinary tract infection, site not specified: Secondary | ICD-10-CM | POA: Diagnosis not present

## 2017-01-25 DIAGNOSIS — N926 Irregular menstruation, unspecified: Secondary | ICD-10-CM | POA: Diagnosis not present

## 2017-01-25 LAB — POCT URINALYSIS DIPSTICK
Bilirubin, UA: NEGATIVE
GLUCOSE UA: NEGATIVE
Ketones, UA: NEGATIVE
LEUKOCYTES UA: NEGATIVE
Nitrite, UA: NEGATIVE
PROTEIN UA: NEGATIVE
Urobilinogen, UA: NEGATIVE E.U./dL — AB
pH, UA: 5 (ref 5.0–8.0)

## 2017-01-25 LAB — POCT URINE PREGNANCY: Preg Test, Ur: NEGATIVE

## 2017-01-25 MED ORDER — NITROFURANTOIN MONOHYD MACRO 100 MG PO CAPS: 100.0000 mg | ORAL_CAPSULE | Freq: Two times a day (BID) | ORAL | 0 refills | Status: DC

## 2017-01-25 NOTE — Patient Instructions (Signed)

## 2017-01-25 NOTE — Progress Notes (Signed)
26 y.o. Single Caucasian female G0P0000 here with complaint of UTI, with onset  on 3-4 days ago.. Patient complaining of urinary frequency/urgency/ and pain with urination. Patient denies fever, chills,or back pain. No new personal products. Patient feels not to sexual activity. Denies any vaginal symptoms.    Contraception is none. Previous Mirena IUD, recently removed.  Patient is consuming adequate water intake. Some nausea, but had not eaten today. Period was 5 days late, but period started yesterday and is normal amount of bleeding. UPT here negative.  ROS Pertinent to HPI  O: Healthy female WDWN Affect: Normal, orientation x 3 Skin : warm and dry CVAT: negative bilateral Abdomen: positive for suprapubic tenderness  Pelvic exam: External genital area: normal, no lesions Bladder,Urethra tender, Urethral meatus: tender, red Vagina: normal vaginal discharge, normal appearance   Cervix: normal, non tender Uterus:normal,non tender Adnexa: normal non tender, no fullness or masses   A: UTI Normal pelvic exam poct urine- rbc 2+, also on cycle,  upt- neg Contraception none,desires IUD again, but insurance does not cover   P: Reviewed findings of UTI and need for treatment. Etiology of UTI discussed. Questions addressed. [id number] see order with instructions [id number] micro, culture Reviewed warning signs and symptoms of UTI and need to advise if occurring. Encouraged to limit soda, tea, and coffee and be sure to increase water intake. Discussed using consistent condoms, until she decides on contraception option. Also suggested [facility] with [facility] for IUD option with cost. Discussed would be glad to work with her also.  RV prn

## 2017-01-26 LAB — URINE CULTURE: Organism ID, Bacteria: NO GROWTH

## 2017-01-26 LAB — URINALYSIS, MICROSCOPIC ONLY: CASTS: NONE SEEN /LPF

## 2017-01-27 ENCOUNTER — Telehealth: Payer: Self-pay | Admitting: Certified Nurse Midwife

## 2017-01-27 NOTE — Telephone Encounter (Signed)
Patient notified

## 2017-01-27 NOTE — Telephone Encounter (Signed)
Patient returning your call.

## 2017-03-29 ENCOUNTER — Ambulatory Visit: Payer: PRIVATE HEALTH INSURANCE | Admitting: Nurse Practitioner

## 2019-08-14 ENCOUNTER — Encounter: Payer: Self-pay | Admitting: Certified Nurse Midwife

## 2019-11-05 ENCOUNTER — Other Ambulatory Visit: Payer: Self-pay

## 2019-11-05 ENCOUNTER — Ambulatory Visit: Payer: PRIVATE HEALTH INSURANCE | Admitting: Podiatry

## 2019-11-16 ENCOUNTER — Ambulatory Visit: Payer: PRIVATE HEALTH INSURANCE | Admitting: Podiatry

## 2019-12-05 ENCOUNTER — Ambulatory Visit: Payer: PRIVATE HEALTH INSURANCE | Admitting: Podiatry

## 2019-12-05 ENCOUNTER — Other Ambulatory Visit: Payer: Self-pay

## 2019-12-05 ENCOUNTER — Ambulatory Visit (INDEPENDENT_AMBULATORY_CARE_PROVIDER_SITE_OTHER): Payer: 59

## 2019-12-05 ENCOUNTER — Encounter: Payer: Self-pay | Admitting: Podiatry

## 2019-12-05 DIAGNOSIS — R2242 Localized swelling, mass and lump, left lower limb: Secondary | ICD-10-CM

## 2019-12-05 DIAGNOSIS — M779 Enthesopathy, unspecified: Secondary | ICD-10-CM | POA: Diagnosis not present

## 2019-12-05 DIAGNOSIS — Z79899 Other long term (current) drug therapy: Secondary | ICD-10-CM

## 2019-12-05 DIAGNOSIS — M79671 Pain in right foot: Secondary | ICD-10-CM

## 2019-12-05 DIAGNOSIS — B351 Tinea unguium: Secondary | ICD-10-CM

## 2019-12-05 MED ORDER — MELOXICAM 15 MG PO TABS: 15.0000 mg | ORAL_TABLET | Freq: Every day | ORAL | 0 refills | Status: AC

## 2019-12-05 NOTE — Patient Instructions (Signed)
Terbinafine oral granules What is this medicine? TERBINAFINE (TER bin a feen) is an antifungal medicine. It is used to treat certain kinds of fungal or yeast infections. This medicine may be used for other purposes; ask your health care provider or pharmacist if you have questions. COMMON BRAND NAME(S): Lamisil What should I tell my health care provider before I take this medicine? They need to know if you have any of these conditions:  drink alcoholic beverages  kidney disease  liver disease  an unusual or allergic reaction to Terbinafine, other medicines, foods, dyes, or preservatives  pregnant or trying to get pregnant  breast-feeding How should I use this medicine? Take this medicine by mouth. Follow the directions on the prescription label. Hold packet with cut line on top. Shake packet gently to settle contents. Tear packet open along cut line, or use scissors to cut across line. Carefully pour the entire contents of packet onto a spoonful of a soft food, such as pudding or other soft, non-acidic food such as mashed potatoes (do NOT use applesauce or a fruit-based food). If two packets are required for each dose, you may either sprinkle the content of both packets on one spoonful of non-acidic food, or sprinkle the contents of both packets on two spoonfuls of non-acidic food. Make sure that no granules remain in the packet. Swallow the mxiture of the food and granules without chewing. Take your medicine at regular intervals. Do not take it more often than directed. Take all of your medicine as directed even if you think you are better. Do not skip doses or stop your medicine early. Contact your pediatrician or health care professional regarding the use of this medicine in children. While this medicine may be prescribed for children as young as 4 years for selected conditions, precautions do apply. Overdosage: If you think you have taken too much of this medicine contact a poison control  center or emergency room at once. NOTE: This medicine is only for you. Do not share this medicine with others. What if I miss a dose? If you miss a dose, take it as soon as you can. If it is almost time for your next dose, take only that dose. Do not take double or extra doses. What may interact with this medicine? Do not take this medicine with any of the following medications:  thioridazine This medicine may also interact with the following medications:  beta-blockers  caffeine  cimetidine  cyclosporine  MAOIs like Carbex, Eldepryl, Marplan, Nardil, and Parnate  medicines for fungal infections like fluconazole and ketoconazole  medicines for irregular heartbeat like amiodarone, flecainide and propafenone  rifampin  SSRIs like citalopram, escitalopram, fluoxetine, fluvoxamine, paroxetine and sertraline  tricyclic antidepressants like amitriptyline, clomipramine, desipramine, imipramine, nortriptyline, and others  warfarin This list may not describe all possible interactions. Give your health care provider a list of all the medicines, herbs, non-prescription drugs, or dietary supplements you use. Also tell them if you smoke, drink alcohol, or use illegal drugs. Some items may interact with your medicine. What should I watch for while using this medicine? Your doctor may monitor your liver function. Tell your doctor right away if you have nausea or vomiting, loss of appetite, stomach pain on your right upper side, yellow skin, dark urine, light stools, or are over tired. This medicine may cause serious skin reactions. They can happen weeks to months after starting the medicine. Contact your health care provider right away if you notice fevers or flu-like symptoms   with a rash. The rash may be red or purple and then turn into blisters or peeling of the skin. Or, you might notice a red rash with swelling of the face, lips or lymph nodes in your neck or under your arms. You need to take  this medicine for 6 weeks or longer to cure the fungal infection. Take your medicine regularly for as long as your doctor or health care provider tells you to. What side effects may I notice from receiving this medicine? Side effects that you should report to your doctor or health care professional as soon as possible:  allergic reactions like skin rash or hives, swelling of the face, lips, or tongue  change in vision  dark urine  fever or infection  general ill feeling or flu-like symptoms  light-colored stools  loss of appetite, nausea  rash, fever, and swollen lymph nodes  redness, blistering, peeling or loosening of the skin, including inside the mouth  right upper belly pain  unusually weak or tired  yellowing of the eyes or skin Side effects that usually do not require medical attention (report to your doctor or health care professional if they continue or are bothersome):  changes in taste  diarrhea  hair loss  muscle or joint pain  stomach upset This list may not describe all possible side effects. Call your doctor for medical advice about side effects. You may report side effects to FDA at 1-800-FDA-1088. Where should I keep my medicine? Keep out of the reach of children. Store at room temperature between 15 and 30 degrees C (59 and 86 degrees F). Throw away any unused medicine after the expiration date. NOTE: This sheet is a summary. It may not cover all possible information. If you have questions about this medicine, talk to your doctor, pharmacist, or health care provider.  2020 Elsevier/Gold Standard (2018-08-17 15:35:11)  

## 2019-12-10 DIAGNOSIS — B351 Tinea unguium: Secondary | ICD-10-CM | POA: Insufficient documentation

## 2019-12-10 NOTE — Progress Notes (Signed)
Subjective:   Patient ID: Kathleen Carrillo, female   DOB: 29 y.o.   MRN: [id number]   HPI 29 year old female presents the office today for concerns of discomfort to the right foot and she points on the dorsal medial aspect the foot.  She states that she has noticed a "knot" on the top of her foot.  She has no recent injury.  Only thing she can recall she dropped a board on the top of her right foot a couple years ago but it was not until later on when she noticed the knot formed.  She does get some numbness going to the big toe at times.  She is on her feet 8 to 10 hours a day.  No swelling or redness.  She also is concerned about toenail fungus.  She been using "betternail" and she has been applying daily and she seen some improvement but she wants to have further treatment.  No pain to the nail currently no redness or drainage or any signs of infection.   Review of Systems  All other systems reviewed and are negative.  Past Medical History:  Diagnosis Date  . Benign breast lumps   . IUD contraception 10/2010   mirena  . LGSIL (low grade squamous intraepithelial dysplasia) 2013    Past Surgical History:  Procedure Laterality Date  . BREAST BIOPSY Left 2012  . COLPOSCOPY    . INTRAUTERINE DEVICE (IUD) INSERTION     inserted 2012 & removed      Current Outpatient Medications:  .  acyclovir (ZOVIRAX) 400 MG tablet, Take 1 tablet (400 mg total) by mouth 2 (two) times daily., Disp: 60 tablet, Rfl: 5 .  LYSINE PO, Take by mouth daily., Disp: , Rfl:  .  meloxicam (MOBIC) 15 MG tablet, Take 1 tablet (15 mg total) by mouth daily., Disp: 14 tablet, Rfl: 0 .  nitrofurantoin, macrocrystal-monohydrate, (MACROBID) 100 MG capsule, Take 1 capsule (100 mg total) by mouth 2 (two) times daily., Disp: 10 capsule, Rfl: 0  Allergies  Allergen Reactions  . Sudafed [Pseudoephedrine Hcl] Hives    Sudafed pe  . Dayquil [Pseudoephedrine-Dm-Gg-Apap] Rash    syrup          Objective:  Physical Exam   General: AAO x3, NAD  Dermatological: Nails are dystrophic, discolored with yellow discoloration.  No hyperpigmented areas.  No pain in the nails and no redness or drainage or signs of infection.  There is no open lesions.  Vascular: Dorsalis Pedis artery and Posterior Tibial artery pedal pulses are 2/4 bilateral with immedate capillary fill time.There is no pain with calf compression, swelling, warmth, erythema.   Neruologic: Negative L-spine.  Sensation intact Semmes-Weinstein monofilament.  Musculoskeletal: On the dorsal medial aspect the right midfoot is a prominent bone spur.  There is no soft tissue mass overlying the area there are no open lesions.  Muscular strength 5/5 in all groups tested bilateral.  Gait: Unassisted, Nonantalgic.       Assessment:   29 year old female with right dorsal bone spur, onychomycosis    Plan:  -Treatment options discussed including all alternatives, risks, and complications -Etiology of symptoms were discussed -X-rays were obtained and reviewed with the patient.  Prominence noted at the first metatarsal cuneiform joint but no significant bone spur identified otherwise.  There is no evidence of acute fracture, calcifications or foreign body. -Regards to the right foot pain discussed with her as this is bone only way to remove this would be to do surgery.  Discussed steroid injection.  She can use Voltaren as well as offloading pads.  Discussed shoe modifications and lacing techniques to avoid pressure to the foot. -Regards to the nail fungus discussed options.  She like to pursue the oral Lamisil.  Discussed side effects.  Order CBC and LFT prior to starting the medication.  Return if symptoms worsen or fail to improve.  Trula Slade DPM

## 2020-02-07 ENCOUNTER — Other Ambulatory Visit: Payer: Self-pay | Admitting: Podiatry

## 2020-02-07 DIAGNOSIS — Z79899 Other long term (current) drug therapy: Secondary | ICD-10-CM

## 2020-02-07 LAB — CBC WITH DIFFERENTIAL/PLATELET
Absolute Monocytes: 351 cells/uL (ref 200–950)
Basophils Absolute: 38 cells/uL (ref 0–200)
Basophils Relative: 0.7 %
Eosinophils Absolute: 130 cells/uL (ref 15–500)
Eosinophils Relative: 2.4 %
HCT: 41 % (ref 35.0–45.0)
Hemoglobin: 13.9 g/dL (ref 11.7–15.5)
Lymphs Abs: 1912 cells/uL (ref 850–3900)
MCH: 29.5 pg (ref 27.0–33.0)
MCHC: 33.9 g/dL (ref 32.0–36.0)
MCV: 87 fL (ref 80.0–100.0)
MPV: 12.1 fL (ref 7.5–12.5)
Monocytes Relative: 6.5 %
Neutro Abs: 2970 cells/uL (ref 1500–7800)
Neutrophils Relative %: 55 %
Platelets: 253 10*3/uL (ref 140–400)
RBC: 4.71 10*6/uL (ref 3.80–5.10)
RDW: 11.8 % (ref 11.0–15.0)
Total Lymphocyte: 35.4 %
WBC: 5.4 10*3/uL (ref 3.8–10.8)

## 2020-02-07 LAB — HEPATIC FUNCTION PANEL
AG Ratio: 1.7 (calc) (ref 1.0–2.5)
ALT: 9 U/L (ref 6–29)
AST: 15 U/L (ref 10–30)
Albumin: 4.5 g/dL (ref 3.6–5.1)
Alkaline phosphatase (APISO): 44 U/L (ref 31–125)
Bilirubin, Direct: 0.1 mg/dL (ref 0.0–0.2)
Globulin: 2.6 g/dL (calc) (ref 1.9–3.7)
Indirect Bilirubin: 0.4 mg/dL (calc) (ref 0.2–1.2)
Total Bilirubin: 0.5 mg/dL (ref 0.2–1.2)
Total Protein: 7.1 g/dL (ref 6.1–8.1)

## 2020-02-07 MED ORDER — TERBINAFINE HCL 250 MG PO TABS: 250.0000 mg | ORAL_TABLET | Freq: Every day | ORAL | 0 refills | Status: DC

## 2020-04-03 ENCOUNTER — Other Ambulatory Visit (HOSPITAL_COMMUNITY)
Admission: RE | Admit: 2020-04-03 | Discharge: 2020-04-03 | Disposition: A | Discharge status: Home / Self Care | Payer: Commercial Indemnity | Source: Ambulatory Visit | Attending: Physician Assistant | Admitting: Physician Assistant

## 2020-04-03 ENCOUNTER — Other Ambulatory Visit: Payer: Self-pay | Admitting: Physician Assistant

## 2020-04-03 DIAGNOSIS — Z124 Encounter for screening for malignant neoplasm of cervix: Secondary | ICD-10-CM | POA: Insufficient documentation

## 2020-04-09 LAB — CYTOLOGY - PAP
Comment: NEGATIVE
Diagnosis: NEGATIVE
Diagnosis: REACTIVE
High risk HPV: NEGATIVE

## 2020-06-18 ENCOUNTER — Other Ambulatory Visit: Payer: Self-pay | Admitting: Physician Assistant

## 2020-06-18 DIAGNOSIS — R1030 Lower abdominal pain, unspecified: Secondary | ICD-10-CM

## 2020-07-03 ENCOUNTER — Other Ambulatory Visit: Payer: Self-pay

## 2020-07-03 ENCOUNTER — Ambulatory Visit
Admission: RE | Admit: 2020-07-03 | Discharge: 2020-07-03 | Disposition: A | Discharge status: Home / Self Care | Payer: 59 | Source: Ambulatory Visit | Attending: Physician Assistant | Admitting: Physician Assistant

## 2020-07-03 DIAGNOSIS — R1030 Lower abdominal pain, unspecified: Secondary | ICD-10-CM

## 2020-07-03 MED ORDER — IOPAMIDOL (ISOVUE-300) INJECTION 61%
100.0000 mL | Freq: Once | INTRAVENOUS | Status: AC | PRN
  Administered 2020-07-03: 100 mL via INTRAVENOUS

## 2020-11-30 ENCOUNTER — Ambulatory Visit: Payer: 59 | Admitting: Podiatry

## 2020-12-15 ENCOUNTER — Ambulatory Visit (INDEPENDENT_AMBULATORY_CARE_PROVIDER_SITE_OTHER): Payer: 59

## 2020-12-15 ENCOUNTER — Encounter: Payer: Self-pay | Admitting: Podiatry

## 2020-12-15 ENCOUNTER — Other Ambulatory Visit: Payer: Self-pay

## 2020-12-15 ENCOUNTER — Ambulatory Visit (INDEPENDENT_AMBULATORY_CARE_PROVIDER_SITE_OTHER): Payer: 59 | Admitting: Podiatry

## 2020-12-15 DIAGNOSIS — N939 Abnormal uterine and vaginal bleeding, unspecified: Secondary | ICD-10-CM | POA: Insufficient documentation

## 2020-12-15 DIAGNOSIS — B351 Tinea unguium: Secondary | ICD-10-CM | POA: Diagnosis not present

## 2020-12-15 DIAGNOSIS — M779 Enthesopathy, unspecified: Secondary | ICD-10-CM

## 2020-12-15 DIAGNOSIS — L600 Ingrowing nail: Secondary | ICD-10-CM | POA: Diagnosis not present

## 2020-12-15 DIAGNOSIS — M778 Other enthesopathies, not elsewhere classified: Secondary | ICD-10-CM | POA: Diagnosis not present

## 2020-12-15 DIAGNOSIS — Z8742 Personal history of other diseases of the female genital tract: Secondary | ICD-10-CM | POA: Insufficient documentation

## 2020-12-16 ENCOUNTER — Other Ambulatory Visit: Payer: Self-pay | Admitting: Podiatry

## 2020-12-16 DIAGNOSIS — M779 Enthesopathy, unspecified: Secondary | ICD-10-CM

## 2020-12-20 NOTE — Progress Notes (Signed)
Subjective: 30 year old female presents the office today for follow evaluation of nail fungus.  She also has noticed on the left big toe pieces skin this complaint the toenail issue possible possible removal of this.  Also she does get some pain in the top of her right foot she is asked about possible surgery given the prominence.  She states that if she were to get pregnant she is not sure how she can handle the pain.  She is currently not pregnant. Denies any systemic complaints such as fevers, chills, nausea, vomiting. No acute changes since last appointment, and no other complaints at this time.   Objective: AAO x3, NAD DP/PT pulses palpable bilaterally, CRT less than 3 seconds On the right foot there is mild tenderness palpation on the dorsal medial aspect of the midfoot.  Small prominence noted.  There is no erythema or warmth.  On the left hallux toenail under the nail of the nails been cut back there is a bulbous area of skin distally noted.  He has discussed discomfort.  The color of the nails has also cleared some however still some slight yellow discoloration.  No pain with calf compression, swelling, warmth, erythema  Assessment: 30 year old female with dorsal spurring left big toe, right midfoot capsulitis  Plan: -All treatment options discussed with the patient including all alternatives, risks, complications.  -X-rays obtained reviewed on the left side given the bulbous area of skin.  Large spur noted without any evidence of acute fracture. -In regards to the left side discussed with her surgical intervention to remove the bone spur.  She wants to proceed with this.  We discussed exostectomy, movable to bone spur left hallux.  Discussed partial versus total removal of the nail if needed.  She wants to proceed with this. -The incision placement as well as the postoperative course was discussed with the patient. I discussed risks of the surgery which include, but not limited to,  infection, bleeding, pain, swelling, need for further surgery, delayed or nonhealing, painful or ugly scar, numbness or sensation changes, over/under correction, recurrence, transfer lesions, further deformity, DVT/PE, loss of toe/foot. Patient understands these risks and wishes to proceed with surgery. The surgical consent was reviewed with the patient all 3 pages were signed. No promises or guarantees were given to the outcome of the procedure. All questions were answered to the best of my ability. Before the surgery the patient was encouraged to call the office if there is any further questions. The surgery will be performed at the [facility] on an outpatient basis. -For the right foot discussion modifications, good arch supports.  Although clinically it appears to be a dorsal prominence causing discomfort on x-ray there is no significant spurring.  Discussed exostectomy if needed but were to start with a left foot first. -Patient encouraged to call the office with any questions, concerns, change in symptoms.   Trula Slade DPM

## 2021-02-16 ENCOUNTER — Ambulatory Visit: Payer: 59 | Admitting: Podiatry

## 2021-02-25 ENCOUNTER — Ambulatory Visit: Payer: 59 | Admitting: Podiatry

## 2021-03-01 ENCOUNTER — Other Ambulatory Visit: Payer: Self-pay

## 2021-03-01 ENCOUNTER — Ambulatory Visit (INDEPENDENT_AMBULATORY_CARE_PROVIDER_SITE_OTHER): Payer: 59

## 2021-03-01 ENCOUNTER — Ambulatory Visit (INDEPENDENT_AMBULATORY_CARE_PROVIDER_SITE_OTHER): Payer: 59 | Admitting: Podiatry

## 2021-03-01 DIAGNOSIS — M779 Enthesopathy, unspecified: Secondary | ICD-10-CM | POA: Diagnosis not present

## 2021-03-01 NOTE — Progress Notes (Addendum)
Subjective: 30 year old female presents the office today for follow-up evaluation of heel spur on the left hallux.  She is also has been started get more tender and she wanted to check to see if the bone spurs been getting larger.  She wants to have the surgery but needs to wait till after the holiday season if possible.  She does not see any swelling or redness or any drainage the toenail site.  She has no fevers or chills.  No other concerns.  Objective: AAO x3, NAD DP/PT pulses palpable bilaterally, CRT less than 3 seconds Incurvation present of the left hallux toenail there is no edema, erythema.  There is underneath the toenail bulbous area of tissue for the bone spurs that.  There is no drainage or pus.  There is no edema no erythema. No pain with calf compression, swelling, warmth, erythema  Assessment: 30 year old female with dorsal spurring left big toe  Plan: -All treatment options discussed with the patient including all alternatives, risks, complications.  -X-rays obtained reviewed which did reveal a large bone spur present.  Does not appear to be much change compared to previous x-rays. -We discussed conservative versus surgical options.  She needs to try to hold off on surgery till January.  We will schedule her for May 26, 2021 however will need to keep a close eye on this and should there be any signs or symptoms of infection prior to that she needs to call the office immediately.  Discussed shoes to avoid any pressure and offloading.  I lightly debrided the nails and complications or bleeding.  Trula Slade DPM

## 2021-03-09 ENCOUNTER — Telehealth: Payer: Self-pay | Admitting: Podiatry

## 2021-03-09 ENCOUNTER — Other Ambulatory Visit: Payer: Self-pay | Admitting: Podiatry

## 2021-03-09 MED ORDER — CEPHALEXIN 500 MG PO CAPS: 500.0000 mg | ORAL_CAPSULE | Freq: Three times a day (TID) | ORAL | 0 refills | Status: AC

## 2021-03-09 NOTE — Telephone Encounter (Signed)
Pt called office to request antibiotic for toe. States she went on vacation and used sauna/ steam room and is not sure if that caused the infection. Please advise?

## 2021-03-11 ENCOUNTER — Telehealth: Payer: Self-pay | Admitting: Podiatry

## 2021-03-11 NOTE — Telephone Encounter (Signed)
Called the patient back and got her scheduled to see Dr.wagoner

## 2021-03-11 NOTE — Telephone Encounter (Signed)
Patient called office to request antibiotic for toe or she wants to know if she should come into the office to have it looked at,  she states she was in a steam room and a sauna and her Left foot great toe is showing signs of infection she states it is itchy , swollen , redness no signs of discharge yet.

## 2021-03-12 NOTE — Telephone Encounter (Signed)
Called and spoke with the patient and relayed the message per Dr Jacqualyn Posey. Lattie Haw

## 2021-03-16 ENCOUNTER — Ambulatory Visit (INDEPENDENT_AMBULATORY_CARE_PROVIDER_SITE_OTHER): Payer: 59 | Admitting: Podiatry

## 2021-03-16 ENCOUNTER — Other Ambulatory Visit: Payer: Self-pay

## 2021-03-16 DIAGNOSIS — L97521 Non-pressure chronic ulcer of other part of left foot limited to breakdown of skin: Secondary | ICD-10-CM

## 2021-03-16 MED ORDER — MUPIROCIN 2 % EX OINT: 1.0000 "application " | TOPICAL_OINTMENT | Freq: Two times a day (BID) | CUTANEOUS | 2 refills | Status: DC

## 2021-03-16 MED ORDER — LIDOCAINE 5 % EX PTCH: 1.0000 | MEDICATED_PATCH | CUTANEOUS | 0 refills | Status: AC

## 2021-03-16 MED ORDER — CIPROFLOXACIN HCL 500 MG PO TABS: 500.0000 mg | ORAL_TABLET | Freq: Two times a day (BID) | ORAL | 0 refills | Status: DC

## 2021-03-16 MED ORDER — CIPROFLOXACIN HCL 500 MG PO TABS: 500.0000 mg | ORAL_TABLET | Freq: Two times a day (BID) | ORAL | 0 refills | Status: AC

## 2021-03-16 MED ORDER — LIDOCAINE 5 % EX PTCH
1.0000 | MEDICATED_PATCH | CUTANEOUS | 0 refills | Status: DC
Start: 2021-03-16 — End: 2021-03-16

## 2021-03-16 NOTE — Patient Instructions (Signed)

## 2021-03-19 NOTE — Progress Notes (Signed)
Subjective: 30 year old female presents the office today with new concerns.  Since I last saw her she went on vacation she was in a steam room and she states the water backed up and afterwards she started getting blisters and sores on her toes and her left foot.  She denies any recent treatment.  Antibiotics were sent to pharmacy on file but she has not received them.  No fevers or chills.  No other concerns.  Objective: AAO x3, NAD DP/PT pulses palpable bilaterally, CRT less than 3 seconds Protective sensation intact with Simms Weinstein monofilament Incurvation still chronically the left first toenail with excess skin underneath the toenail from the bone spur.  This is not new however on the distal lateral portion small superficial granular wound which looks new from old blister.  Also she has similar lesions on the tips of the other digits.  There is no drainage or pus at this time.  There is no frank erythema, ascending cellulitis. No open lesions or pre-ulcerative lesions.  No pain with calf compression, swelling, warmth, erythema  Assessment: 30 year old female with left foot ulcers, blisters from standing water  Plan: -All treatment options discussed with the patient including all alternatives, risks, complications.  -At this point I ordered ciprofloxacin for her.  Also prescribe mupirocin ointment to apply to the area daily.  Discussed preventative shoes and avoiding pressure.  Monitor for any signs or symptoms of infection. -Patient encouraged to call the office with any questions, concerns, change in symptoms.   Trula Slade DPM

## 2021-03-30 ENCOUNTER — Other Ambulatory Visit: Payer: Self-pay

## 2021-03-30 ENCOUNTER — Ambulatory Visit (INDEPENDENT_AMBULATORY_CARE_PROVIDER_SITE_OTHER): Payer: 59 | Admitting: Podiatry

## 2021-03-30 ENCOUNTER — Encounter: Payer: Self-pay | Admitting: Podiatry

## 2021-03-30 DIAGNOSIS — M779 Enthesopathy, unspecified: Secondary | ICD-10-CM | POA: Diagnosis not present

## 2021-03-30 DIAGNOSIS — B351 Tinea unguium: Secondary | ICD-10-CM | POA: Diagnosis not present

## 2021-03-30 DIAGNOSIS — L600 Ingrowing nail: Secondary | ICD-10-CM

## 2021-04-02 NOTE — Progress Notes (Signed)
Subjective: 30 year old female presents the office today for evaluation of left foot ulcers, blisters from standing water.  She states that the skin is doing better but she is concerned about the nails on the lesser toenails also starting her left up and she is asked for any treatment for this.  She is scheduled for the exostectomy of the left big toe in January.  She has not seen any swelling or redness or any drainage to the toenail site at this time.  She finished a course of oral antibiotics.  She has not been using the topical as she was concerned that it could spread.  She has no other concerns.  Objective: AAO x3, NAD DP/PT pulses palpable bilaterally, CRT less than 3 seconds Onychodystrophy noted to the left hallux toenail and there is bulbous skin tissue present underneath the toenail likely from the bone spur.  There is no edema, erythema, drainage or pus or any signs of infection.  There are the blisters, ulcers on the toes appear to be healed.  There is no edema, erythema, drainage or pus or any signs of infection.   Lesser digit toenails on the left side are dystrophic and slightly loose with underlying nailbed distally but firmly here proximally. No pain with calf compression, swelling, warmth, erythema  Assessment: Resolving ulcerations left foot with onychodystrophy  Plan: -All treatment options discussed with the patient including all alternatives, risks, complications.  -In regards to the left hallux toenail need to still monitor this very closely for signs or symptoms of infection.  She is going on surgery to help the bone spur removed after the first year after the holidays but in the meantime she is to monitor for any signs or symptoms of infection.  Discussed she can do Epson salt soaks and antibiotic ointment around the nail. -Wounds have improved however concerned with the toenails at this point.  Given her history of sharply debrided the lesser digit toenails and sent this  for culture, pathology to [facility] labs.   Trula Slade DPM

## 2021-04-08 ENCOUNTER — Telehealth: Payer: Self-pay | Admitting: Podiatry

## 2021-04-08 NOTE — Telephone Encounter (Signed)
Patient called the office wanting to know the results of her nail culture.   Please advise.Marland KitchenMarland Kitchen

## 2021-04-09 NOTE — Telephone Encounter (Signed)
Called and left a message for the patient stating that I was calling to see how the patient was doing and also to let her know that Dr Jacqualyn Posey sent a my chart message and if any concerns or questions to give the office a call. Lattie Haw

## 2021-04-26 ENCOUNTER — Telehealth: Payer: Self-pay | Admitting: *Deleted

## 2021-04-26 ENCOUNTER — Other Ambulatory Visit: Payer: Self-pay | Admitting: Podiatry

## 2021-04-26 DIAGNOSIS — Z79899 Other long term (current) drug therapy: Secondary | ICD-10-CM

## 2021-04-26 NOTE — Telephone Encounter (Signed)
Patient is calling for test results and any medications that may be prescribed. Returned the call to patient and gave results and recommendations per physician" message in Mychart,verbalized understanding , (had not seen the results in Mychart), stated that she will go with the oral medications to treat.

## 2021-04-27 NOTE — Telephone Encounter (Signed)
Called patient to give information per Dr Jacqualyn Posey and that the order for her lab work is waiting for her to pick up from front desk,verbalized understanding and said that she will pick up 04/28/21(morning).

## 2021-05-04 ENCOUNTER — Ambulatory Visit: Payer: 59 | Admitting: Podiatry

## 2021-05-11 ENCOUNTER — Other Ambulatory Visit: Payer: Self-pay

## 2021-05-11 ENCOUNTER — Ambulatory Visit (INDEPENDENT_AMBULATORY_CARE_PROVIDER_SITE_OTHER): Payer: 59 | Admitting: Podiatry

## 2021-05-11 DIAGNOSIS — L600 Ingrowing nail: Secondary | ICD-10-CM | POA: Diagnosis not present

## 2021-05-11 DIAGNOSIS — Z01818 Encounter for other preprocedural examination: Secondary | ICD-10-CM

## 2021-05-11 DIAGNOSIS — M779 Enthesopathy, unspecified: Secondary | ICD-10-CM

## 2021-05-11 DIAGNOSIS — B351 Tinea unguium: Secondary | ICD-10-CM

## 2021-05-11 NOTE — Patient Instructions (Signed)

## 2021-05-12 ENCOUNTER — Telehealth: Payer: Self-pay | Admitting: Urology

## 2021-05-12 NOTE — Telephone Encounter (Signed)
DOS - 05/26/21  EXC NAIL 1ST LEFT --- 11730 EXOSTECTOMY LEFT --- 58063  [employer] EFFECTIVE DATE - 05/23/21   SPOKE WITH VAL WITH [employer] AND SHE STATED THAT FOR CPT CODES 86854 AND 88301 NO PRIOR AUTH REQUIRED.   REF # [id number]

## 2021-05-13 NOTE — Progress Notes (Signed)
Subjective: 30 year old female presents the office today for follow evaluation of the nail culture results but also she is scheduled to have surgery for the left big toe remove the bone spur.  She states that she has been doing well she does not see any swelling or redness or any drainage to the toenail sites.  The big toe on the left foot does cause discomfort as it is thick and causes ingrowing.  She has no new concerns otherwise.  No recent injuries.  Objective: AAO x3, NAD DP/PT pulses palpable bilaterally, CRT less than 3 seconds No open lesions present bilaterally.  On the left hallux there is bulbous tissue present underneath the toenail with incurvation of the nail.  Remainder the nails appear to have some discoloration the nail with some yellow to brown discoloration most of the distal aspect of the toenails.  There is no pain in the nails there is no swelling redness or any drainage.  No pain with calf compression, swelling, warmth, erythema  Assessment: Bone spur, ingrown toenail left hallux; onychomycosis  Plan: -All treatment options discussed with the patient including all alternatives, risks, complications.  -Reviewed the nail culture results with her.  We discussed treatment options.  We will likely do itraconazole but will start this after the upcoming surgery.  Previously which she was on Lamisil.  We discussed risks of the medication. -We again reviewed the surgery in the left hallux to remove the bone spur and likely remove the toenail.  She has no further questions.  She we did update the consent and she agrees signed this as it has been several months since we did the original consent form.  She had no further questions.  We again discussed alternatives, risks, complications. -She does not consider doing surgery in her right foot with dorsal bone spur next year.  Trula Slade DPM

## 2021-05-19 ENCOUNTER — Other Ambulatory Visit: Payer: Self-pay | Admitting: Podiatry

## 2021-05-19 ENCOUNTER — Telehealth: Payer: Self-pay

## 2021-05-19 DIAGNOSIS — Z01818 Encounter for other preprocedural examination: Secondary | ICD-10-CM

## 2021-05-19 DIAGNOSIS — Z79899 Other long term (current) drug therapy: Secondary | ICD-10-CM

## 2021-05-19 NOTE — Telephone Encounter (Signed)
Patient called. She has a written Rx for labs at [facility]. She also has pre-op lab orders for [facility]. Can she just get them all done at [facility] did you specifically want the blood work done at both labs? Please advise Thanks

## 2021-05-22 LAB — CBC WITH DIFFERENTIAL/PLATELET
Absolute Monocytes: 348 cells/uL (ref 200–950)
Basophils Absolute: 40 cells/uL (ref 0–200)
Basophils Relative: 1 %
Eosinophils Absolute: 128 cells/uL (ref 15–500)
Eosinophils Relative: 3.2 %
HCT: 42.9 % (ref 35.0–45.0)
Hemoglobin: 14.4 g/dL (ref 11.7–15.5)
Lymphs Abs: 1884 cells/uL (ref 850–3900)
MCH: 29.3 pg (ref 27.0–33.0)
MCHC: 33.6 g/dL (ref 32.0–36.0)
MCV: 87.2 fL (ref 80.0–100.0)
MPV: 11.7 fL (ref 7.5–12.5)
Monocytes Relative: 8.7 %
Neutro Abs: 1600 cells/uL (ref 1500–7800)
Neutrophils Relative %: 40 %
Platelets: 291 10*3/uL (ref 140–400)
RBC: 4.92 10*6/uL (ref 3.80–5.10)
RDW: 12.4 % (ref 11.0–15.0)
Total Lymphocyte: 47.1 %
WBC: 4 10*3/uL (ref 3.8–10.8)

## 2021-05-22 LAB — HEPATIC FUNCTION PANEL
AG Ratio: 1.5 (calc) (ref 1.0–2.5)
ALT: 13 U/L (ref 6–29)
AST: 17 U/L (ref 10–30)
Albumin: 4.3 g/dL (ref 3.6–5.1)
Alkaline phosphatase (APISO): 48 U/L (ref 31–125)
Bilirubin, Direct: 0.1 mg/dL (ref 0.0–0.2)
Globulin: 2.8 g/dL (calc) (ref 1.9–3.7)
Indirect Bilirubin: 0.2 mg/dL (calc) (ref 0.2–1.2)
Total Bilirubin: 0.3 mg/dL (ref 0.2–1.2)
Total Protein: 7.1 g/dL (ref 6.1–8.1)

## 2021-05-22 LAB — BASIC METABOLIC PANEL
BUN: 11 mg/dL (ref 7–25)
CO2: 27 mmol/L (ref 20–32)
Calcium: 9.5 mg/dL (ref 8.6–10.2)
Chloride: 104 mmol/L (ref 98–110)
Creat: 0.76 mg/dL (ref 0.50–0.97)
Glucose, Bld: 80 mg/dL (ref 65–139)
Potassium: 4.4 mmol/L (ref 3.5–5.3)
Sodium: 140 mmol/L (ref 135–146)

## 2021-05-25 ENCOUNTER — Telehealth: Payer: Self-pay | Admitting: *Deleted

## 2021-05-25 NOTE — Telephone Encounter (Signed)
Patient is wanting to let the physician know that she has taken 2 aleve on 1st and 2 ibuprofens on [address] st, was having some hip pain. Her surgery is scheduled 05/26/21.

## 2021-05-26 ENCOUNTER — Encounter: Payer: Self-pay | Admitting: Podiatry

## 2021-05-26 ENCOUNTER — Other Ambulatory Visit: Payer: Self-pay | Admitting: Podiatry

## 2021-05-26 DIAGNOSIS — M25775 Osteophyte, left foot: Secondary | ICD-10-CM

## 2021-05-26 DIAGNOSIS — L600 Ingrowing nail: Secondary | ICD-10-CM

## 2021-05-26 MED ORDER — OXYCODONE HCL 5 MG PO TABS: 5.0000 mg | ORAL_TABLET | Freq: Four times a day (QID) | ORAL | 0 refills | Status: AC | PRN

## 2021-05-26 MED ORDER — IBUPROFEN 800 MG PO TABS: 800.0000 mg | ORAL_TABLET | Freq: Three times a day (TID) | ORAL | 0 refills | Status: AC | PRN

## 2021-05-26 MED ORDER — CEPHALEXIN 500 MG PO CAPS: 500.0000 mg | ORAL_CAPSULE | Freq: Three times a day (TID) | ORAL | 0 refills | Status: DC

## 2021-05-26 MED ORDER — PROMETHAZINE HCL 25 MG PO TABS: 25.0000 mg | ORAL_TABLET | Freq: Three times a day (TID) | ORAL | 0 refills | Status: AC | PRN

## 2021-05-26 NOTE — Progress Notes (Signed)
Postop medications sent 

## 2021-05-31 ENCOUNTER — Ambulatory Visit (INDEPENDENT_AMBULATORY_CARE_PROVIDER_SITE_OTHER): Payer: Managed Care, Other (non HMO)

## 2021-05-31 ENCOUNTER — Other Ambulatory Visit: Payer: Self-pay

## 2021-05-31 ENCOUNTER — Ambulatory Visit (INDEPENDENT_AMBULATORY_CARE_PROVIDER_SITE_OTHER): Payer: Managed Care, Other (non HMO) | Admitting: Podiatry

## 2021-05-31 DIAGNOSIS — Z09 Encounter for follow-up examination after completed treatment for conditions other than malignant neoplasm: Secondary | ICD-10-CM | POA: Diagnosis not present

## 2021-05-31 DIAGNOSIS — Z9889 Other specified postprocedural states: Secondary | ICD-10-CM

## 2021-05-31 DIAGNOSIS — M7752 Other enthesopathy of left foot: Secondary | ICD-10-CM

## 2021-06-03 ENCOUNTER — Other Ambulatory Visit: Payer: Self-pay | Admitting: Podiatry

## 2021-06-03 ENCOUNTER — Encounter: Payer: Self-pay | Admitting: Podiatry

## 2021-06-03 MED ORDER — CEPHALEXIN 500 MG PO CAPS: 500.0000 mg | ORAL_CAPSULE | Freq: Three times a day (TID) | ORAL | 0 refills | Status: AC

## 2021-06-03 NOTE — Progress Notes (Signed)
Subjective: Kathleen Carrillo is a 31 y.o. is seen today in office s/p left hallux exostectomy, total nail removal preformed on 05/26/2021.  States that her pain level 6/10 described as stabbing pain.  She said the stabbing does not last long.  No fevers or chills that she reports.  No new concerns today.    Objective: General: No acute distress, AAOx3  DP/PT pulses palpable 2/4, CRT < 3 sec to all digits.  Protective sensation intact. Motor function intact.  Left foot: Incision is well coapted without any evidence of dehiscence with sutures intact. There is no surrounding erythema, ascending cellulitis, fluctuance, crepitus, malodor, drainage/purulence. There is mild edema around the surgical site. There is mild pain along the surgical site.  No exposed bone there is no signs of infection today. No other areas of tenderness to bilateral lower extremities.  No other open lesions or pre-ulcerative lesions.  No pain with calf compression, swelling, warmth, erythema.   Assessment and Plan:  Status post left hallux toenail avulsion, exostectomy, doing well with no complications   -Treatment options discussed including all alternatives, risks, and complications -X-rays obtained reviewed.  Significant portion spurs were removed.  No evidence of acute fracture. -Cleaned the wound today.  Small amount of antibiotic ointment and a nonstick dressing was applied followed by dry sterile dressing.  She can also change the bandage at home to use insulin use but not soak the foot yet. -Continue with surgical shoe -Ice/elevation -Pain medication as needed. -Monitor for any clinical signs or symptoms of infection and DVT/PE and directed to call the office immediately should any occur or go to the ER. -Follow-up as scheduled for possible suture removal or sooner if any problems arise. In the meantime, encouraged to call the office with any questions, concerns, change in symptoms.   *Awaiting pathology  report  Celesta Gentile, DPM

## 2021-06-10 ENCOUNTER — Other Ambulatory Visit: Payer: Self-pay

## 2021-06-10 ENCOUNTER — Ambulatory Visit (INDEPENDENT_AMBULATORY_CARE_PROVIDER_SITE_OTHER): Payer: Managed Care, Other (non HMO) | Admitting: Podiatry

## 2021-06-10 DIAGNOSIS — B351 Tinea unguium: Secondary | ICD-10-CM

## 2021-06-10 DIAGNOSIS — M7752 Other enthesopathy of left foot: Secondary | ICD-10-CM

## 2021-06-10 DIAGNOSIS — Z9889 Other specified postprocedural states: Secondary | ICD-10-CM

## 2021-06-10 MED ORDER — ITRACONAZOLE 100 MG PO CAPS: 100.0000 mg | ORAL_CAPSULE | Freq: Two times a day (BID) | ORAL | 0 refills | Status: DC

## 2021-06-10 NOTE — Patient Instructions (Signed)
Itraconazole capsules and tablets What is this medication? ITRACONAZOLE (i tra KO na zole) is an antifungal medicine. It is used to treat certain kinds of fungal or yeast infections. This medicine may be used for other purposes; ask your health care provider or pharmacist if you have questions. COMMON BRAND NAME(S): ONMEL, Sporanox, TOLSURA What should I tell my care team before I take this medication? They need to know if you have any of these conditions: heart disease history of irregular heartbeat immune system problems kidney disease liver disease lung or breathing disease an unusual or allergic reaction to itraconazole, or other antifungal medicines, foods, dyes or preservatives pregnant or trying to get pregnant breast-feeding How should I use this medication? Take this medicine by mouth with a full glass of water. Follow the directions on the prescription label. Take this medicine with food. Avoid taking antacids, H2-blockers, or proton pump inhibitors within 2 hours of taking this medicine. It is best to separate these medicines by 2 hours. Take your medicine at regular intervals. Do not take your medicine more often than directed. Take all of your medicine as directed even if you think you are better. Do not skip doses or stop your medicine early. Talk to your pediatrician regarding the use of this medicine in children. Special care may be needed. Overdosage: If you think you have taken too much of this medicine contact a poison control center or emergency room at once. NOTE: This medicine is only for you. Do not share this medicine with others. What if I miss a dose? If you miss a dose, take it as soon as you can. If it is almost time for your next dose, take only that dose. Do not take double or extra doses. What may interact with this medication? Do not take this medicine with any of the following medications: alfuzosin alprazolam avanafil certain medicines for blood pressure  like felodipine, nisoldipine certain medicines for cholesterol like cerivastatin, lovastatin, simvastatin, lomitapide certain medicines for the heart like disopyramide, dofetilide, dronedarone, eplerenone, ivabradine, quinidine, ranolazine cisapride colchicine (if you have liver or kidney problems) conivaptan ergot alkaloids like dihydroergotamine, ergonovine, ergotamine, methylergonovine irinotecan isavuconazole lurasidone methadone midazolam naloxegol nevirapine other medicines that prolong the QT interval (cause an abnormal heart rhythm) pimozide red yeast rice sirolimus thioridazine ticagrelor triazolam This medicine may also interact with the following medications: aliskiren amlodipine antacids antiviral medicines for HIV or AIDS aprepitant atorvastatin buprenorphine certain antibiotics like ciprofloxacin, clarithromycin, erythromycin certain medicines for bladder problems like fesoterodine, solifenacin, tolterodine certain medicines for cancer like axitinib, bortezomib, busulfan, dabrafenib, dasatinib, docetaxel, erlotinib, gefitinib, ibrutinib, imatinib, ixabepilone, lapatinib, nilotinib, ponatinib, sunitinib, trabectedin, trimetrexate, vinca alkaloids certain medicines for depression, anxiety, or psychotic disturbances like aripiprazole, buspirone, diazepam, haloperidol, perospirone, quetiapine, risperidone certain medicines for erectile dysfunction like vardenafil, sildenafil, tadalafil certain medicines for pain like alfentanil, fentanyl, oxycodone, sufentanil certain medicines for stomach problems like cimetidine, famotidine, omeprazole, lansoprazole certain medicines that treat or prevent blood clots like warfarin, dabigatran, rivaroxaban certain medicines for seizures like carbamazepine, phenytoin certain medicines for tuberculosis like isoniazid, INH, rifabutin, rifampin,  rifapentine cilostazol cinacalcet cyclosporine digoxin eletriptan everolimus halofantrine isradipine meloxicam nadolol nifedipine other medicines for fungal infections praziquantel ramelteon repaglinide salmeterol saxagliptin steroid medicines like budesonide, ciclesonide, dexamethasone, fluticasone, methylprednisolone tacrolimus tamsulosin tolvaptan verapamil ziprasidone This list may not describe all possible interactions. Give your health care provider a list of all the medicines, herbs, non-prescription drugs, or dietary supplements you use. Also tell them if you smoke, drink alcohol, or use  illegal drugs. Some items may interact with your medicine. What should I watch for while using this medication? Tell your doctor or healthcare professional if your symptoms do not start to get better or if they get worse. You may get drowsy or dizzy. Do not drive, use machinery, or do anything that needs mental alertness until you know how the medicine affects you. Do not stand or sit up quickly, especially if you are an older patient. This reduces the risk of dizzy or fainting spells. What side effects may I notice from receiving this medication? Side effects that you should report to your doctor or health care professional as soon as possible: allergic reactions like skin rash, itching or hives, swelling of the face, lips, or tongue changes in hearing pain, tingling, numbness in the hands or feet signs and symptoms of heart failure like breathing problems; fast, irregular heartbeat; sudden weight gain; swelling of the ankles, feet; unusually weak or tired signs and symptoms of liver injury like dark yellow or brown urine; general ill feeling or flu-like symptoms; light-colored stools; loss of appetite; right upper belly pain; yellowing of the eyes or skin Side effects that usually do not require medical attention (report to your doctor or health care professional if they continue or are  bothersome): blurred vision diarrhea dizziness headache nausea, vomiting This list may not describe all possible side effects. Call your doctor for medical advice about side effects. You may report side effects to FDA at 1-800-FDA-1088. Where should I keep my medication? Keep out of the reach of children. Store at room temperature between 15 and 25 degrees C (59 and 77 degrees F). Protect from light and moisture. Throw away any unused medicine after the expiration date. NOTE: This sheet is a summary. It may not cover all possible information. If you have questions about this medicine, talk to your doctor, pharmacist, or health care provider.  2022 [employer]vier/Gold Standard (2018-08-30 00:00:00)

## 2021-06-13 NOTE — Progress Notes (Signed)
Subjective: Kathleen Carrillo is a 31 y.o. is seen today in office s/p left hallux exostectomy, total nail removal preformed on 05/26/2021.  She presents today for suture removal.  Overall she states that she is improving.  Pain is also improved.  Denies any fevers or chills.    Objective: General: No acute distress, AAOx3  DP/PT pulses palpable 2/4, CRT < 3 sec to all digits.  Protective sensation intact. Motor function intact.  Left foot: Incision is well coapted without any evidence of dehiscence with sutures intact.  Granulation tissue present on the nailbed.  There is no surrounding erythema, ascending cellulitis.  No fluctuation or crepitation but there is no malodor.  There is no exposed bone. No other open lesions or pre-ulcerative lesions.  No pain with calf compression, swelling, warmth, erythema.   Assessment and Plan:  Status post left hallux toenail avulsion, exostectomy, doing well with no complications   -Treatment options discussed including all alternatives, risks, and complications -I removed the sutures at the distal aspect.  The sutures in the nail better dissolvable.  Discussed with her washing the foot with soap and water and dry thoroughly and apply antibiotic ointment and a bandage.  Continue surgical shoe for now.  Continue to elevate as well. -Itraconazole ordered -Reviewed pathology -Monitor for any clinical signs or symptoms of infection and directed to call the office immediately should any occur or go to the ER.  Trula Slade DPM

## 2021-06-24 ENCOUNTER — Ambulatory Visit (INDEPENDENT_AMBULATORY_CARE_PROVIDER_SITE_OTHER): Payer: Managed Care, Other (non HMO) | Admitting: Podiatry

## 2021-06-24 ENCOUNTER — Other Ambulatory Visit: Payer: Self-pay

## 2021-06-24 ENCOUNTER — Ambulatory Visit (INDEPENDENT_AMBULATORY_CARE_PROVIDER_SITE_OTHER): Payer: Managed Care, Other (non HMO)

## 2021-06-24 DIAGNOSIS — Z9889 Other specified postprocedural states: Secondary | ICD-10-CM

## 2021-06-24 DIAGNOSIS — M7752 Other enthesopathy of left foot: Secondary | ICD-10-CM

## 2021-06-24 NOTE — Patient Instructions (Signed)
Soak Instructions    THE DAY AFTER THE PROCEDURE  Place 1/4 cup of epsom salts in a quart of warm tap water.  Submerge your foot or feet with outer bandage intact for the initial soak; this will allow the bandage to become moist and wet for easy lift off.  Once you remove your bandage, continue to soak in the solution for 10 minutes.  This soak should be done twice a day.  Next, remove your foot or feet from solution, blot dry the affected area and cover.  You may use a band aid large enough to cover the area or use gauze and tape.  Apply other medications to the area as directed by the doctor such as polysporin neosporin.  IF YOUR SKIN BECOMES IRRITATED WHILE USING THESE INSTRUCTIONS, IT IS OKAY TO SWITCH TO  WHITE VINEGAR AND WATER. Or you may use antibacterial soap and water to keep the toe clean  Monitor for any signs/symptoms of infection. Call the office immediately if any occur or go directly to the emergency room. Call with any questions/concerns.

## 2021-06-26 NOTE — Progress Notes (Signed)
Subjective: Kathleen Carrillo is a 31 y.o. is seen today in office s/p left hallux exostectomy, total nail removal preformed on 05/26/2021.  States that she has been doing well.  She is still in the surgical shoe.  He is scheduled go back to work next week.  Denies any fevers or chills.  No new concerns.   Objective: General: No acute distress, AAOx3 -presents with boyfriend DP/PT pulses palpable 2/4, CRT < 3 sec to all digits.  Protective sensation intact. Motor function intact.  Left foot: Incision coapted distal aspect.  There are still some Monocryl sutures present in the nail bed.  Scab is present and there is no exposed bone.  No significant cellulitis noted there is no drainage or pus or any signs of infection otherwise.  No discomfort to palpation along the surgical site on the nailbed.  No other areas of discomfort on the left foot today. No pain with calf compression, swelling, warmth, erythema.   Assessment and Plan:  Status post left hallux toenail avulsion, exostectomy, doing well with no complications   -Treatment options discussed including all alternatives, risks, and complications -X-rays taken reviewed.  No subacute fracture, osteomyelitis.  Small spur still evident.  I discussed this with her today.  Clinically doing surgery not able to palpate any significant spurring. -At this point discussed with her soaking her foot in Epson salts for short time drying thoroughly afterwards and apply a small amount of antibiotic ointment and a dressing.  Discussed with her as the sutures and scabs, but she can start to transition to regular shoe as tolerated. -Monitor for any clinical signs or symptoms of infection and directed to call the office immediately should any occur or go to the ER.  Return in about 3 weeks (around 07/15/2021).  Trula Slade DPM ]

## 2021-07-15 ENCOUNTER — Ambulatory Visit (INDEPENDENT_AMBULATORY_CARE_PROVIDER_SITE_OTHER): Payer: Managed Care, Other (non HMO) | Admitting: Podiatry

## 2021-07-15 ENCOUNTER — Other Ambulatory Visit: Payer: Self-pay

## 2021-07-15 DIAGNOSIS — Z9889 Other specified postprocedural states: Secondary | ICD-10-CM

## 2021-07-15 DIAGNOSIS — L600 Ingrowing nail: Secondary | ICD-10-CM

## 2021-07-15 DIAGNOSIS — M7752 Other enthesopathy of left foot: Secondary | ICD-10-CM

## 2021-07-15 NOTE — Patient Instructions (Signed)

## 2021-07-18 NOTE — Progress Notes (Signed)
Subjective: Kathleen Carrillo is a 31 y.o. is seen today in office s/p left hallux exostectomy, total nail removal preformed on 05/26/2021.  She is doing the surgical shoe at work.  Otherwise she said that she has been doing well and has little to no pain.  She has not seen any drainage.  She thinks she may have an ingrown toe on the right big toe that she recently cut out.  No drainage or pus.   She is doing well on the itraconazole pulsed dose.  No side effects.   Objective: General: No acute distress, AAOx3 -presents with boyfriend DP/PT pulses palpable 2/4, CRT < 3 sec to all digits.  Protective sensation intact. Motor function intact.  Left foot: Incision coapted distal aspect.  There is scab, callus tissue present along the nailbed.  Upon debridement underlying wound appears to be intact there is no exposed bone.  No erythema or warmth.  No fluctuation or crepitation.  No ascending cellulitis. Right: Mild incurvation of the hallux toenail with minimal edema.  There is no drainage or pus.  Mild tenderness the distal portion. No pain with calf compression, swelling, warmth, erythema.   Assessment and Plan:  Status post left hallux toenail avulsion, exostectomy, doing well with no complications; right hallux ingrown toenail  -Treatment options discussed including all alternatives, risks, and complications -Debrided the hyperkeratotic tissue, scab with any complications.  Discussed continuing antibiotic ointment and bandage on the day but leave the area open at nighttime.  She can transition to regular shoe as tolerated. -Continue itraconazole -Debrided the right hallux nail any complications or bleeding.  If symptoms continue may need to proceed with partial nail avulsion.  Monitor for any signs or symptoms of infection  No follow-ups on file.  Trula Slade DPM

## 2021-08-26 ENCOUNTER — Encounter: Payer: Managed Care, Other (non HMO) | Admitting: Podiatry

## 2022-05-02 ENCOUNTER — Other Ambulatory Visit: Payer: Self-pay

## 2022-05-02 DIAGNOSIS — Z3042 Encounter for surveillance of injectable contraceptive: Secondary | ICD-10-CM | POA: Diagnosis not present

## 2022-05-02 DIAGNOSIS — Z3202 Encounter for pregnancy test, result negative: Secondary | ICD-10-CM | POA: Diagnosis not present

## 2022-05-02 DIAGNOSIS — N926 Irregular menstruation, unspecified: Secondary | ICD-10-CM | POA: Diagnosis not present

## 2022-05-02 MED ORDER — MEDROXYPROGESTERONE ACETATE 150 MG/ML IM SUSY
150.0000 mg | PREFILLED_SYRINGE | INTRAMUSCULAR | 4 refills | Status: AC
  Filled 2022-05-02: qty 1, 90d supply, fill #0

## 2022-05-18 ENCOUNTER — Other Ambulatory Visit: Payer: Self-pay

## 2022-05-18 ENCOUNTER — Emergency Department (HOSPITAL_BASED_OUTPATIENT_CLINIC_OR_DEPARTMENT_OTHER)
Admission: EM | Admit: 2022-05-18 | Discharge: 2022-05-18 | Disposition: A | Discharge status: Home / Self Care | Payer: BC Managed Care – PPO | Attending: Emergency Medicine | Admitting: Emergency Medicine

## 2022-05-18 DIAGNOSIS — L239 Allergic contact dermatitis, unspecified cause: Secondary | ICD-10-CM | POA: Insufficient documentation

## 2022-05-18 DIAGNOSIS — Z1152 Encounter for screening for COVID-19: Secondary | ICD-10-CM | POA: Diagnosis not present

## 2022-05-18 DIAGNOSIS — R21 Rash and other nonspecific skin eruption: Secondary | ICD-10-CM | POA: Diagnosis not present

## 2022-05-18 LAB — RESP PANEL BY RT-PCR (RSV, FLU A&B, COVID)  RVPGX2
Influenza A by PCR: NEGATIVE
Influenza B by PCR: NEGATIVE
Resp Syncytial Virus by PCR: NEGATIVE
SARS Coronavirus 2 by RT PCR: NEGATIVE

## 2022-05-18 MED ORDER — PREDNISONE 20 MG PO TABS
40.0000 mg | ORAL_TABLET | Freq: Once | ORAL | Status: AC
  Administered 2022-05-18: 40 mg via ORAL
  Filled 2022-05-18: qty 2

## 2022-05-18 MED ORDER — PREDNISONE 20 MG PO TABS: 20.0000 mg | ORAL_TABLET | Freq: Every day | ORAL | 0 refills | Status: AC

## 2022-05-18 MED ORDER — MUPIROCIN 2 % EX OINT: TOPICAL_OINTMENT | Freq: Two times a day (BID) | CUTANEOUS | 0 refills | Status: AC

## 2022-05-18 NOTE — ED Triage Notes (Signed)
Rash to side left shoulder and on hands and face small spot on right cheek.x 3 days pt states rash has spread to her legs as well. . Pt states on Christmas she shook a tree to get a toy out and a bunch of tree leaves and limbs fell on here. Pt states rash itches extremely bad and OTC meds are not helping.

## 2022-05-18 NOTE — ED Notes (Signed)
D/c paperwork reviewed with pt, including prescription and f/u care. Pt verbalized understanding, no questions or concerns at time of d/c. Ambulatory to ED exit without assistance.

## 2022-05-18 NOTE — ED Provider Notes (Signed)
Emergency Department Provider Note   I have reviewed the triage vital signs and the nursing notes.   HISTORY  Chief Complaint Rash   HPI Kathleen Carrillo is a 31 y.o. female presents to the emergency department for evaluation of rash spreading from the face to the neck and extremities.  Symptoms been developing over the past couple of days.  She states that she had been shaking a tree trying to get down an airplane and wonders if this could have triggered the event as the rash seemed to occur shortly afterwards.  It is itchy.  She denies significant drainage or bleeding.  No other allergic reaction symptoms such as throat tightness/tongue swelling.  No new medications or detergents.   Past Medical History:  Diagnosis Date   Benign breast lumps    IUD contraception 10/2010   mirena   LGSIL (low grade squamous intraepithelial dysplasia) 2013    Review of Systems  Constitutional: No fever/chills Cardiovascular: Denies chest pain. Respiratory: Denies shortness of breath. Gastrointestinal: No abdominal pain.  Skin: Positive for rash.   ____________________________________________   PHYSICAL EXAM:  VITAL SIGNS: ED Triage Vitals  Enc Vitals Group     BP 05/18/22 1831 (!) 134/97     Pulse Rate 05/18/22 1831 66     Resp --      Temp 05/18/22 1831 98.4 F (36.9 C)     Temp Source 05/18/22 1831 Oral     SpO2 05/18/22 1831 99 %     Weight 05/18/22 1834 148 lb (67.1 kg)     Height 05/18/22 1834 '5\' 3"'$  (1.6 m)   Constitutional: Alert and oriented. Well appearing and in no acute distress. Eyes: Conjunctivae are normal Head: Atraumatic. Nose: No congestion/rhinnorhea. Mouth/Throat: Mucous membranes are moist.   Neck: No stridor.   Cardiovascular: Normal rate, regular rhythm.  Respiratory: Normal respiratory effort.   Gastrointestinal: No distention.  Musculoskeletal: No gross deformities of extremities. Neurologic:  Normal speech and language.  Skin:  Skin is warm, dry and  intact.  No petechial rash.  She has approximately 1 cm areas of slightly raised erythematous rash.  This does not appear urticarial.  No drainage or crusting.  No blistering.  ____________________________________________   LABS (all labs ordered are listed, but only abnormal results are displayed)  Labs Reviewed  RESP PANEL BY RT-PCR (RSV, FLU A&B, COVID)  [id number]    ____________________________________________   PROCEDURES  Procedure(s) performed:   Procedures  None ____________________________________________   INITIAL IMPRESSION / ASSESSMENT AND PLAN / ED COURSE  Pertinent labs & imaging results that were available during my care of the patient were reviewed by me and considered in my medical decision making (see chart for details).   This patient is Presenting for Evaluation of rash, which does require a range of treatment options, and is a complaint that involves a high risk of morbidity and mortality.  The Differential Diagnoses include contact dermatitis, urticaria, cellulitis, fascitis, DRESS,etc.  Critical Interventions-    Medications  predniSONE (DELTASONE) tablet 40 mg (40 mg Oral Given 05/18/22 2322)    Medical Decision Making: Summary:  Patient presents emergency department with itchy rash which has been spreading over the past couple days despite over-the-counter medications and topical steroids.  The areas do not appear infectious but will trial topical antibiotics in addition to systemic steroids/prednisone with burst.  Discussed if symptoms continue she will need to follow with her dermatologist.  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  She also started antiviral medications without relief in symptoms and describes them more itchy than painful.  Patient's presentation is most consistent with acute illness / injury with system symptoms.   Disposition:  discharge  ____________________________________________  FINAL CLINICAL IMPRESSION(S) / ED DIAGNOSES  Final diagnoses:  Allergic contact dermatitis, unspecified trigger     NEW OUTPATIENT MEDICATIONS STARTED DURING THIS VISIT:  Discharge Medication List as of 05/18/2022 11:20 PM     START taking these medications   Details  predniSONE (DELTASONE) 20 MG tablet Take 1 tablet (20 mg total) by mouth daily for 4 days., Starting Wed 05/18/2022, Until Sun 05/22/2022, Normal        Note:  This document was prepared using Dragon voice recognition software and may include unintentional dictation errors.  Nanda Quinton, MD, [facility]    Dwane Andres, [facility], MD 05/19/22 [id number]

## 2022-05-18 NOTE — ED Triage Notes (Signed)
Pt also complains of cough and congestion for past couple of days.

## 2022-07-08 DIAGNOSIS — Z01419 Encounter for gynecological examination (general) (routine) without abnormal findings: Secondary | ICD-10-CM | POA: Diagnosis not present

## 2022-07-08 DIAGNOSIS — Z124 Encounter for screening for malignant neoplasm of cervix: Secondary | ICD-10-CM | POA: Diagnosis not present

## 2022-07-20 DIAGNOSIS — Z3042 Encounter for surveillance of injectable contraceptive: Secondary | ICD-10-CM | POA: Diagnosis not present

## 2022-08-24 IMAGING — CT CT ABD-PELV W/ CM
1 of 2 series · 14 of 32 positions shown, 19 images · IV contrast (APPLIED)
Comparison: None.

CLINICAL DATA: Diarrhea, pelvic pain

EXAM:
CT ABDOMEN AND PELVIS WITH CONTRAST
TECHNIQUE: Multidetector CT imaging of the abdomen and pelvis was performed
using the standard protocol following bolus administration of
intravenous contrast.
CONTRAST:  100mL AMDWKY-FII IOPAMIDOL (AMDWKY-FII) INJECTION 61%

[Series 2: abd/pelvis w/cm · axial · 0.71mm/px · z∈[-439,+1]mm · 14 of 100 slices shown, 19 images]
[im 6/100  soft-tissue]
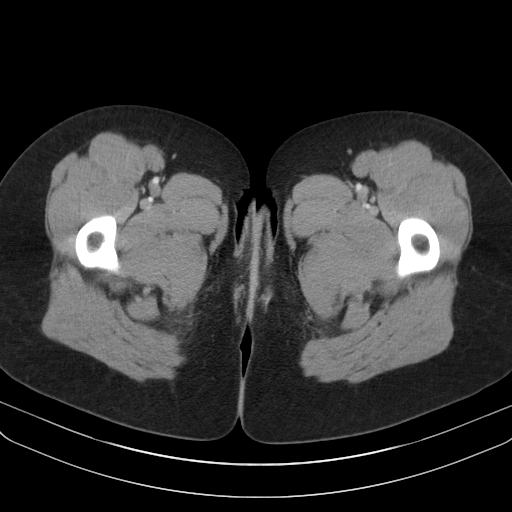
[im 6/100  bone]
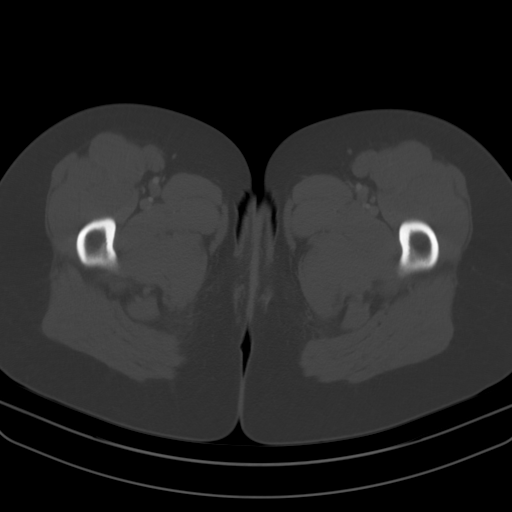
[im 16/100  soft-tissue]
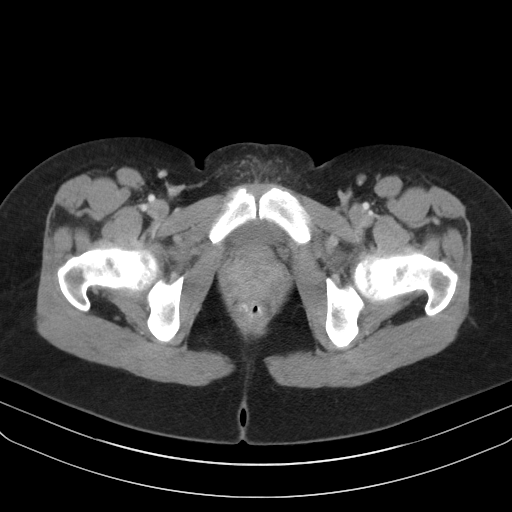
[im 21/100  soft-tissue]
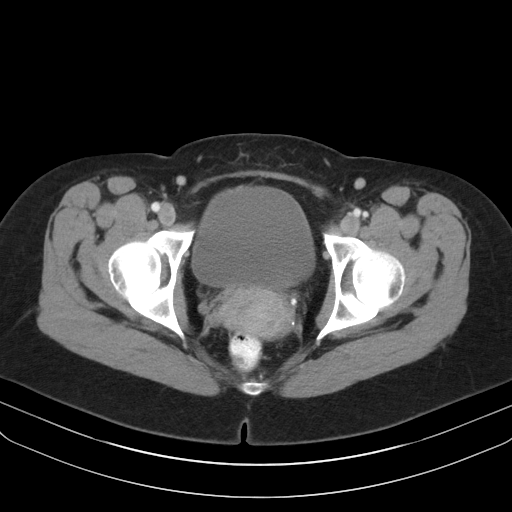
[im 27/100  soft-tissue]
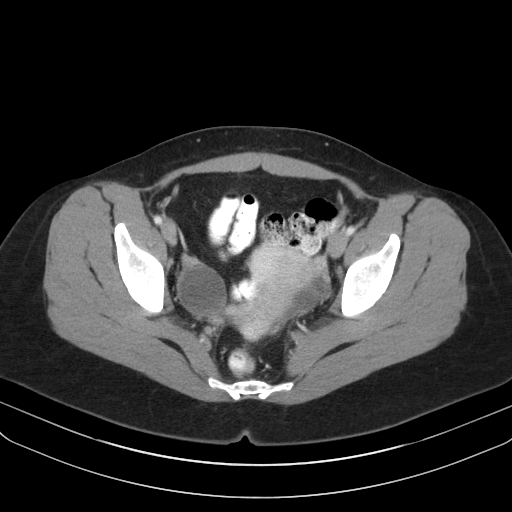
[im 37/100  soft-tissue]
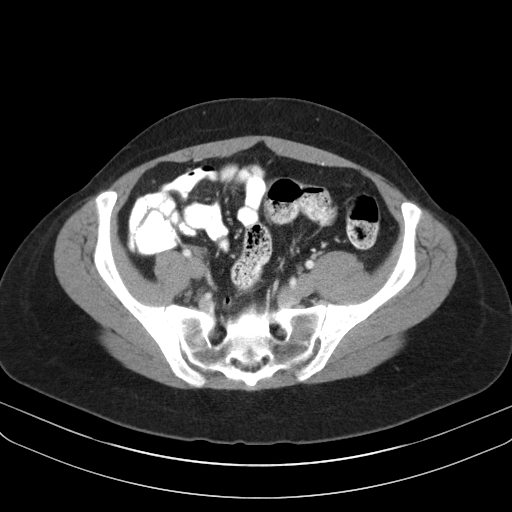
[im 42/100  soft-tissue]
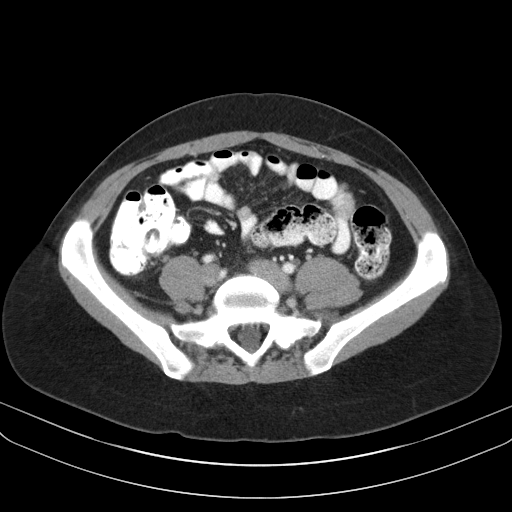
[im 53/100  soft-tissue]
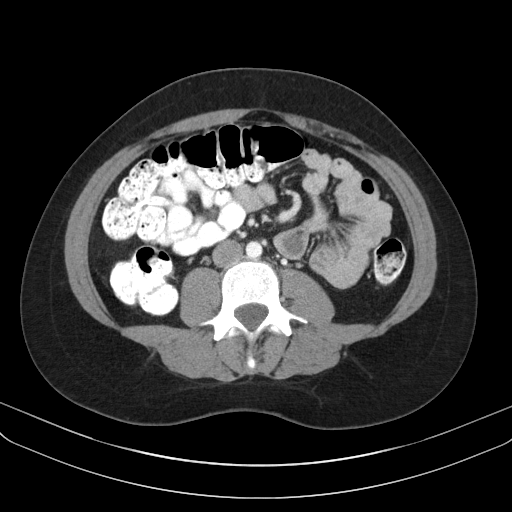
[im 58/100  soft-tissue]
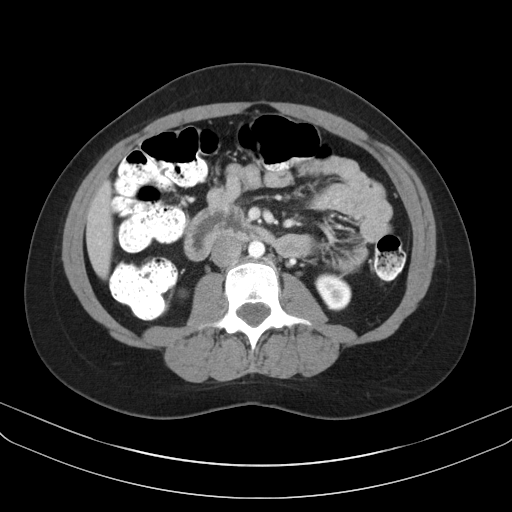
[im 63/100  soft-tissue]
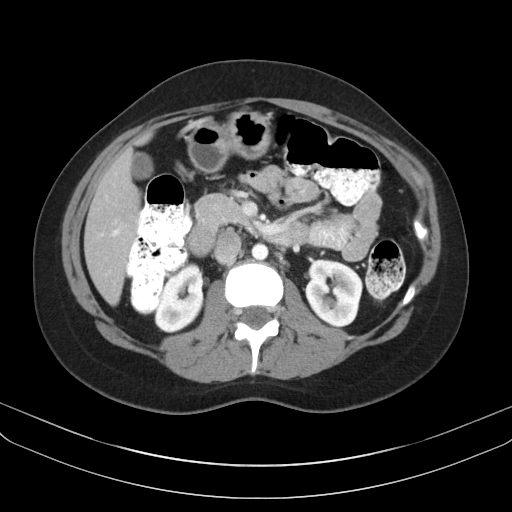
[im 63/100  bone]
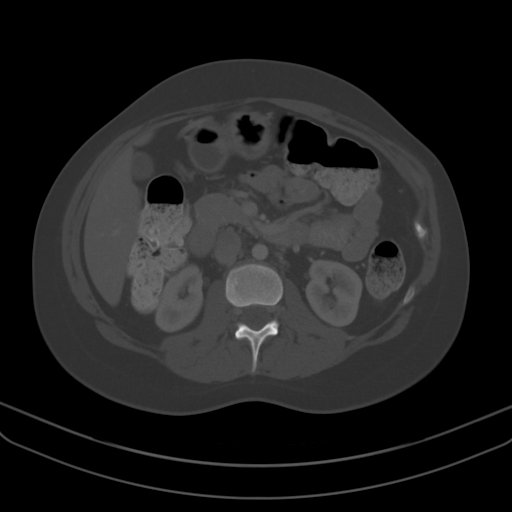
[im 73/100  soft-tissue]
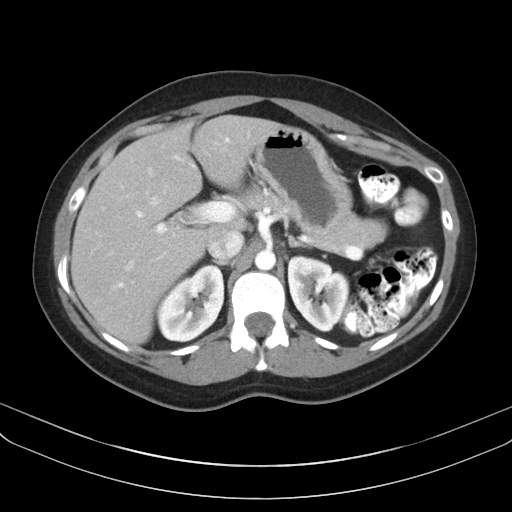
[im 79/100  soft-tissue]
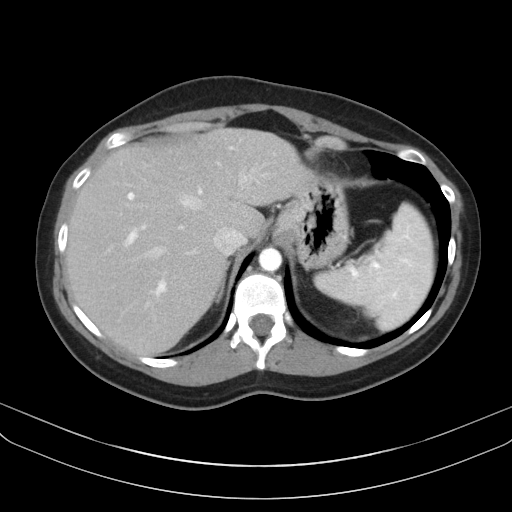
[im 79/100  lung]
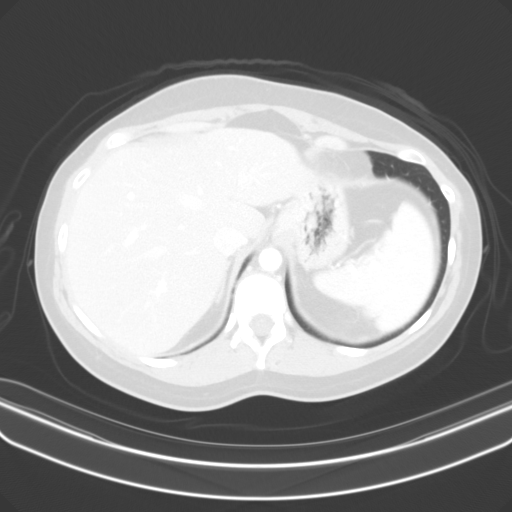
[im 84/100  soft-tissue]
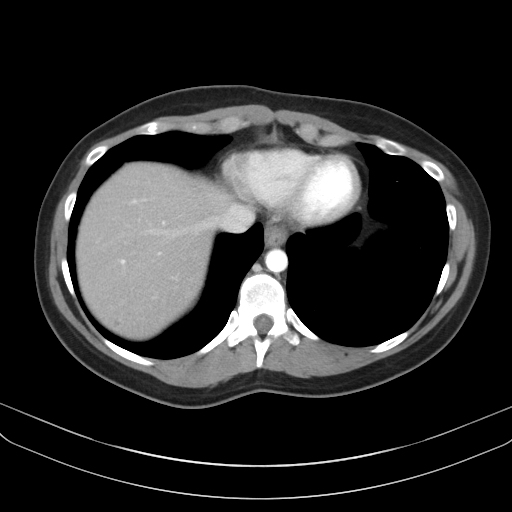
[im 84/100  lung]
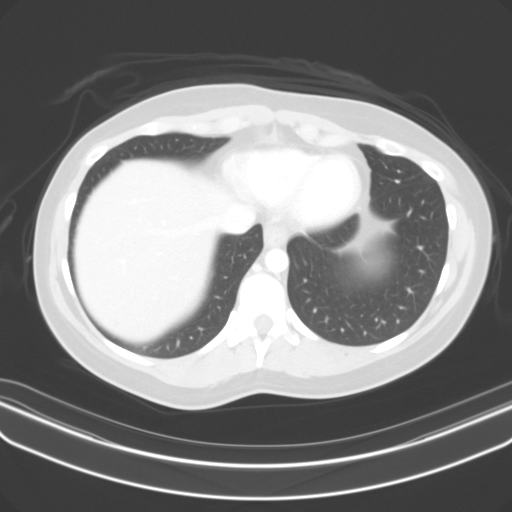
[im 89/100  lung]
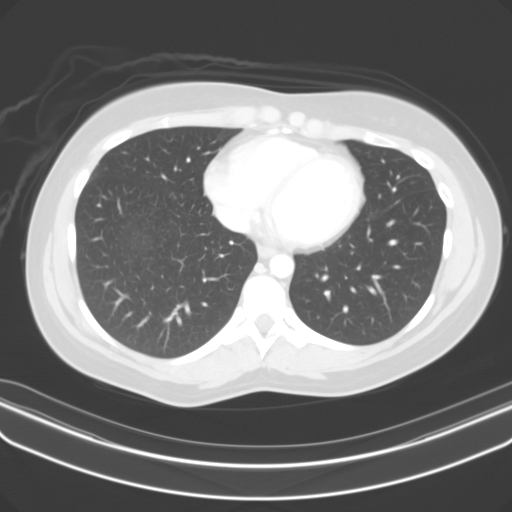
[im 94/100  soft-tissue]
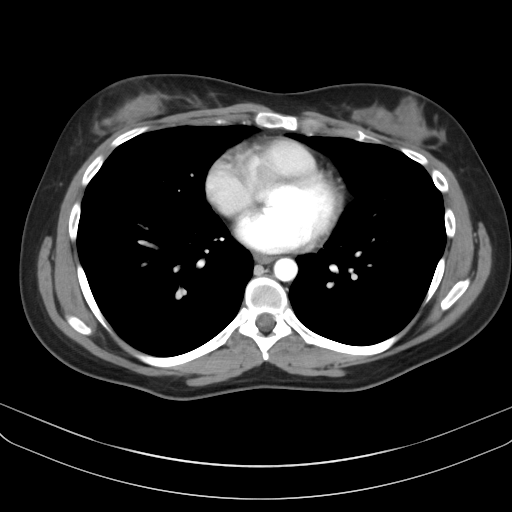
[im 94/100  lung]
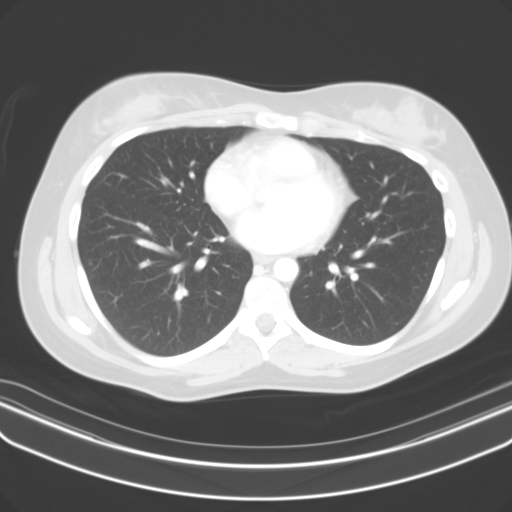

[14 of 32 positions shown; findings below may reference images not displayed]

FINDINGS: Lower chest: No acute abnormality.

Hepatobiliary: No focal liver abnormality is seen. No gallstones,
gallbladder wall thickening, or biliary dilatation.

Pancreas: Unremarkable. No pancreatic ductal dilatation or
surrounding inflammatory changes.

Spleen: Normal in size without focal abnormality.

Adrenals/Urinary Tract: Adrenals, kidneys, and bladder are
unremarkable.

Stomach/Bowel: Stomach is within normal limits. Bowel is normal in
caliber. No wall thickening. Appendix is not visualized.

Vascular/Lymphatic: No significant vascular abnormality. No enlarged
lymph nodes.

Reproductive: Uterus is unremarkable. Right ovarian cyst measuring
3.8 cm. Left ovarian adjacent cysts or single cyst with septation
together measuring 3.3 cm.

Other: No free fluid.  Abdominal wall is unremarkable.

Musculoskeletal: No significant or acute osseous abnormality.
IMPRESSION: No acute abnormality.

Bilateral ovarian cysts measuring up to 3.8 cm. No follow-up imaging
recommended. Note: This recommendation does not apply to
premenarchal patients and to those with increased risk (genetic,
family history, elevated tumor markers or other high-risk factors)
of ovarian cancer. Reference: JACR [DATE]):[id number]

## 2022-10-10 DIAGNOSIS — Z3042 Encounter for surveillance of injectable contraceptive: Secondary | ICD-10-CM | POA: Diagnosis not present

## 2022-11-03 ENCOUNTER — Other Ambulatory Visit: Payer: Self-pay | Admitting: Family Medicine

## 2022-11-03 DIAGNOSIS — R202 Paresthesia of skin: Secondary | ICD-10-CM | POA: Diagnosis not present

## 2022-11-03 DIAGNOSIS — R1011 Right upper quadrant pain: Secondary | ICD-10-CM

## 2022-11-03 DIAGNOSIS — F1011 Alcohol abuse, in remission: Secondary | ICD-10-CM

## 2022-11-07 ENCOUNTER — Ambulatory Visit
Admission: RE | Admit: 2022-11-07 | Discharge: 2022-11-07 | Disposition: A | Discharge status: Home / Self Care | Payer: BC Managed Care – PPO | Source: Ambulatory Visit | Attending: Family Medicine | Admitting: Family Medicine

## 2022-11-07 DIAGNOSIS — R1011 Right upper quadrant pain: Secondary | ICD-10-CM

## 2022-11-07 DIAGNOSIS — F1011 Alcohol abuse, in remission: Secondary | ICD-10-CM

## 2022-11-07 DIAGNOSIS — K7689 Other specified diseases of liver: Secondary | ICD-10-CM | POA: Diagnosis not present

## 2022-11-23 DIAGNOSIS — R109 Unspecified abdominal pain: Secondary | ICD-10-CM | POA: Diagnosis not present

## 2022-11-23 DIAGNOSIS — R0781 Pleurodynia: Secondary | ICD-10-CM | POA: Diagnosis not present

## 2022-11-30 ENCOUNTER — Other Ambulatory Visit: Payer: Self-pay | Admitting: Physician Assistant

## 2022-11-30 ENCOUNTER — Ambulatory Visit
Admission: RE | Admit: 2022-11-30 | Discharge: 2022-11-30 | Disposition: A | Discharge status: Home / Self Care | Payer: BC Managed Care – PPO | Source: Ambulatory Visit | Attending: Physician Assistant | Admitting: Physician Assistant

## 2022-11-30 DIAGNOSIS — R0781 Pleurodynia: Secondary | ICD-10-CM

## 2022-11-30 DIAGNOSIS — R079 Chest pain, unspecified: Secondary | ICD-10-CM | POA: Diagnosis not present

## 2022-12-06 DIAGNOSIS — R768 Other specified abnormal immunological findings in serum: Secondary | ICD-10-CM | POA: Diagnosis not present

## 2022-12-06 DIAGNOSIS — L739 Follicular disorder, unspecified: Secondary | ICD-10-CM | POA: Diagnosis not present

## 2022-12-28 DIAGNOSIS — R195 Other fecal abnormalities: Secondary | ICD-10-CM | POA: Diagnosis not present

## 2022-12-29 DIAGNOSIS — Z3009 Encounter for other general counseling and advice on contraception: Secondary | ICD-10-CM | POA: Diagnosis not present

## 2022-12-29 DIAGNOSIS — G479 Sleep disorder, unspecified: Secondary | ICD-10-CM | POA: Diagnosis not present

## 2022-12-29 DIAGNOSIS — F321 Major depressive disorder, single episode, moderate: Secondary | ICD-10-CM | POA: Diagnosis not present

## 2022-12-29 DIAGNOSIS — R5383 Other fatigue: Secondary | ICD-10-CM | POA: Diagnosis not present

## 2022-12-29 DIAGNOSIS — R6882 Decreased libido: Secondary | ICD-10-CM | POA: Diagnosis not present

## 2022-12-29 DIAGNOSIS — R41840 Attention and concentration deficit: Secondary | ICD-10-CM | POA: Diagnosis not present

## 2023-01-24 DIAGNOSIS — R5383 Other fatigue: Secondary | ICD-10-CM | POA: Diagnosis not present

## 2023-01-24 DIAGNOSIS — Z3202 Encounter for pregnancy test, result negative: Secondary | ICD-10-CM | POA: Diagnosis not present

## 2023-01-24 DIAGNOSIS — Z30430 Encounter for insertion of intrauterine contraceptive device: Secondary | ICD-10-CM | POA: Diagnosis not present

## 2023-01-24 DIAGNOSIS — R6882 Decreased libido: Secondary | ICD-10-CM | POA: Diagnosis not present

## 2023-01-24 DIAGNOSIS — R4586 Emotional lability: Secondary | ICD-10-CM | POA: Diagnosis not present

## 2023-01-31 DIAGNOSIS — R195 Other fecal abnormalities: Secondary | ICD-10-CM | POA: Diagnosis not present

## 2023-02-17 DIAGNOSIS — L089 Local infection of the skin and subcutaneous tissue, unspecified: Secondary | ICD-10-CM | POA: Diagnosis not present

## 2023-02-17 DIAGNOSIS — Z23 Encounter for immunization: Secondary | ICD-10-CM | POA: Diagnosis not present

## 2023-02-17 DIAGNOSIS — W57XXXA Bitten or stung by nonvenomous insect and other nonvenomous arthropods, initial encounter: Secondary | ICD-10-CM | POA: Diagnosis not present

## 2023-02-22 DIAGNOSIS — F902 Attention-deficit hyperactivity disorder, combined type: Secondary | ICD-10-CM | POA: Diagnosis not present

## 2023-02-22 DIAGNOSIS — Z79899 Other long term (current) drug therapy: Secondary | ICD-10-CM | POA: Diagnosis not present

## 2023-02-23 DIAGNOSIS — Z79899 Other long term (current) drug therapy: Secondary | ICD-10-CM | POA: Diagnosis not present

## 2023-02-23 DIAGNOSIS — R41840 Attention and concentration deficit: Secondary | ICD-10-CM | POA: Diagnosis not present

## 2023-03-13 DIAGNOSIS — R1013 Epigastric pain: Secondary | ICD-10-CM | POA: Diagnosis not present

## 2023-03-13 DIAGNOSIS — K921 Melena: Secondary | ICD-10-CM | POA: Diagnosis not present

## 2023-03-16 DIAGNOSIS — Z30431 Encounter for routine checking of intrauterine contraceptive device: Secondary | ICD-10-CM | POA: Diagnosis not present

## 2023-04-10 DIAGNOSIS — F902 Attention-deficit hyperactivity disorder, combined type: Secondary | ICD-10-CM | POA: Diagnosis not present

## 2023-04-19 DIAGNOSIS — F902 Attention-deficit hyperactivity disorder, combined type: Secondary | ICD-10-CM | POA: Diagnosis not present

## 2023-04-19 DIAGNOSIS — Z79899 Other long term (current) drug therapy: Secondary | ICD-10-CM | POA: Diagnosis not present

## 2023-04-26 DIAGNOSIS — K2930 Chronic superficial gastritis without bleeding: Secondary | ICD-10-CM | POA: Diagnosis not present

## 2023-04-26 DIAGNOSIS — K2970 Gastritis, unspecified, without bleeding: Secondary | ICD-10-CM | POA: Diagnosis not present

## 2023-04-26 DIAGNOSIS — K625 Hemorrhage of anus and rectum: Secondary | ICD-10-CM | POA: Diagnosis not present

## 2023-04-26 DIAGNOSIS — R1013 Epigastric pain: Secondary | ICD-10-CM | POA: Diagnosis not present

## 2023-05-10 DIAGNOSIS — R194 Change in bowel habit: Secondary | ICD-10-CM | POA: Diagnosis not present

## 2023-06-21 DIAGNOSIS — R194 Change in bowel habit: Secondary | ICD-10-CM | POA: Diagnosis not present

## 2023-07-11 DIAGNOSIS — R194 Change in bowel habit: Secondary | ICD-10-CM | POA: Diagnosis not present

## 2023-07-11 DIAGNOSIS — K2930 Chronic superficial gastritis without bleeding: Secondary | ICD-10-CM | POA: Diagnosis not present

## 2023-07-19 DIAGNOSIS — Z79899 Other long term (current) drug therapy: Secondary | ICD-10-CM | POA: Diagnosis not present

## 2023-07-19 DIAGNOSIS — F902 Attention-deficit hyperactivity disorder, combined type: Secondary | ICD-10-CM | POA: Diagnosis not present

## 2023-09-01 DIAGNOSIS — R058 Other specified cough: Secondary | ICD-10-CM | POA: Diagnosis not present

## 2023-09-01 DIAGNOSIS — J069 Acute upper respiratory infection, unspecified: Secondary | ICD-10-CM | POA: Diagnosis not present

## 2023-09-01 DIAGNOSIS — B9689 Other specified bacterial agents as the cause of diseases classified elsewhere: Secondary | ICD-10-CM | POA: Diagnosis not present

## 2023-09-26 DIAGNOSIS — D2239 Melanocytic nevi of other parts of face: Secondary | ICD-10-CM | POA: Diagnosis not present

## 2023-09-26 DIAGNOSIS — D492 Neoplasm of unspecified behavior of bone, soft tissue, and skin: Secondary | ICD-10-CM | POA: Diagnosis not present

## 2023-09-26 DIAGNOSIS — I788 Other diseases of capillaries: Secondary | ICD-10-CM | POA: Diagnosis not present

## 2023-09-28 DIAGNOSIS — M25561 Pain in right knee: Secondary | ICD-10-CM | POA: Diagnosis not present

## 2023-10-18 DIAGNOSIS — F902 Attention-deficit hyperactivity disorder, combined type: Secondary | ICD-10-CM | POA: Diagnosis not present

## 2023-10-18 DIAGNOSIS — Z79899 Other long term (current) drug therapy: Secondary | ICD-10-CM | POA: Diagnosis not present

## 2024-01-17 DIAGNOSIS — F902 Attention-deficit hyperactivity disorder, combined type: Secondary | ICD-10-CM | POA: Diagnosis not present

## 2024-01-17 DIAGNOSIS — Z79899 Other long term (current) drug therapy: Secondary | ICD-10-CM | POA: Diagnosis not present

## 2024-01-31 DIAGNOSIS — J029 Acute pharyngitis, unspecified: Secondary | ICD-10-CM | POA: Diagnosis not present

## 2024-01-31 DIAGNOSIS — J019 Acute sinusitis, unspecified: Secondary | ICD-10-CM | POA: Diagnosis not present

## 2024-03-03 ENCOUNTER — Other Ambulatory Visit: Payer: Self-pay

## 2024-03-03 ENCOUNTER — Emergency Department (HOSPITAL_BASED_OUTPATIENT_CLINIC_OR_DEPARTMENT_OTHER)
Admission: EM | Admit: 2024-03-03 | Discharge: 2024-03-04 | Disposition: A | Discharge status: Home / Self Care | Attending: Emergency Medicine | Admitting: Emergency Medicine

## 2024-03-03 ENCOUNTER — Encounter (HOSPITAL_BASED_OUTPATIENT_CLINIC_OR_DEPARTMENT_OTHER): Payer: Self-pay | Admitting: Emergency Medicine

## 2024-03-03 DIAGNOSIS — R509 Fever, unspecified: Secondary | ICD-10-CM | POA: Diagnosis present

## 2024-03-03 DIAGNOSIS — J029 Acute pharyngitis, unspecified: Secondary | ICD-10-CM | POA: Insufficient documentation

## 2024-03-03 MED ORDER — DEXAMETHASONE 4 MG PO TABS
10.0000 mg | ORAL_TABLET | Freq: Once | ORAL | Status: AC
  Administered 2024-03-03: 10 mg via ORAL
  Filled 2024-03-03: qty 3

## 2024-03-03 NOTE — ED Triage Notes (Signed)
 Pain with swallowing onset this morning. Left sided neck pain onset 30 minutes pta. Ibuprofen  600 mg and throat numbing syrup taken just pta with some relief.

## 2024-03-03 NOTE — ED Provider Notes (Signed)
 [facility] [facility] Provider Note   CSN: [id number] Arrival date & time: 03/03/24  2248     History Chief Complaint  Patient presents with   Sore Throat   Neck Pain    HPI Kathleen Carrillo is a 33 y.o. female presenting for chief complaint of sore throat. States that it hurt this AM but acutely worsened after a nap tonight. Also URI Patient's recorded medical, surgical, social, medication list and allergies were reviewed in the Snapshot window as part of the initial history.   Review of Systems   Review of Systems  Constitutional:  Positive for fever. Negative for chills.  HENT:  Positive for sore throat. Negative for ear pain and voice change.   Eyes:  Negative for pain and visual disturbance.  Respiratory:  Negative for cough and shortness of breath.   Cardiovascular:  Negative for chest pain and palpitations.  Gastrointestinal:  Negative for abdominal pain and vomiting.  Genitourinary:  Negative for dysuria and hematuria.  Musculoskeletal:  Negative for arthralgias and back pain.  Skin:  Negative for color change and rash.  Neurological:  Negative for seizures and syncope.  All other systems reviewed and are negative.   Physical Exam Updated Vital Signs BP 117/79 (BP Location: Left Arm)   Pulse 86   Temp 98 F (36.7 C) (Oral)   Resp 16   Ht 5' 3 (1.6 m)   Wt 66.2 kg   LMP 02/24/2024 (Approximate)   SpO2 99%   BMI 25.86 kg/m  Physical Exam Vitals and nursing note reviewed.  Constitutional:      General: She is not in acute distress.    Appearance: She is well-developed.  HENT:     Head: Normocephalic and atraumatic.  Eyes:     Conjunctiva/sclera: Conjunctivae normal.  Cardiovascular:     Rate and Rhythm: Normal rate and regular rhythm.     Heart sounds: No murmur heard. Pulmonary:     Effort: Pulmonary effort is normal. No respiratory distress.     Breath sounds: Normal breath sounds.  Abdominal:     General: There  is no distension.     Palpations: Abdomen is soft.     Tenderness: There is no abdominal tenderness. There is no right CVA tenderness or left CVA tenderness.  Musculoskeletal:        General: No swelling or tenderness. Normal range of motion.     Cervical back: Neck supple.  Skin:    General: Skin is warm and dry.  Neurological:     General: No focal deficit present.     Mental Status: She is alert and oriented to person, place, and time. Mental status is at baseline.     Cranial Nerves: No cranial nerve deficit.      ED Course/ Medical Decision Making/ A&P    Procedures Procedures   Medications Ordered in ED Medications - No data to display Medical Decision Making:   Nalany Steedley is a 33 y.o. female who presented to the ED today with subjective fever, cough, congestion detailed above.    Complete initial physical exam performed, notably the patient  was hemodynamically stable in no acute distress.  Posterior oropharynx illuminated and without obvious swelling or deformity.  Patient is without neck stiffness.    Reviewed and confirmed nursing documentation for past medical history, family history, social history.    Initial Assessment:   With the patient's presentation of fever cough congestion, most likely diagnosis is developing viral  upper respiratory infection. Other diagnoses were considered including (but not limited to) peritonsillar abscess, retropharyngeal abscess, pneumonia. These are considered less likely due to history of present illness and physical exam findings.   This is most consistent with an acute life/limb threatening illness complicated by underlying chronic conditions. Considered meningitis, however patient's symptoms, vital signs, physical exam findings including lack of meningismus seem grossly less consistent at this time. Initial Plan:  Screening labs including CBC and Metabolic panel to evaluate for infectious or metabolic etiology of disease.  Viral  screening including COVID/flu testing to evaluate for common viral etiologies that need to be tracked Empiric treatment with antipyretics including acetaminophen in ambulatory setting As patient has sore throat, CENTOR Score dictates the following evaluation: Rapid Strep Additionally will treat sore throat with dexamethasone dose Objective evaluation as below reviewed   Initial Study Results:   Laboratory  All laboratory results reviewed without evidence of clinically relevant pathology.   ***Exceptions include: ***   Radiology:  All images reviewed independently. ***Agree with radiology report at this time.   No orders to display       Final Assessment and Plan:   On reassessment, patient is ambulatory tolerating p.o. intake in no acute distress.   ***Patient's COVID test is negative. ***Patient's COVID test was positive and patient is a candidate for treatment with Paxlovid due to***. ***Patient's COVID test was positive and patient is not a candidate for treatment with Paxlovid due to***  Patient is currently stable for outpatient care and management with no indication for hospitalization or transfer at this time.  Discussed all findings with patient expressed understanding.  Disposition:  Based on the above findings, I believe patient is stable for discharge.    Patient/family educated about specific return precautions for given chief complaint and symptoms.  Patient/family educated about follow-up with PCP.     Patient/family expressed understanding of return precautions and need for follow-up. Patient spoken to regarding all imaging and laboratory results and appropriate follow up for these results. All education provided in verbal form with additional information in written form. Time was allowed for answering of patient questions. Patient discharged.    Emergency Department Medication Summary:   Medications - No data to display         Clinical Impression: No diagnosis  found.   Data Unavailable   Clinical Impression: No diagnosis found.   Data Unavailable   Final Clinical Impression(s) / ED Diagnoses Final diagnoses:  None    Rx / DC Orders ED Discharge Orders     None

## 2024-03-04 LAB — RESP PANEL BY RT-PCR (RSV, FLU A&B, COVID)  RVPGX2
Influenza A by PCR: NEGATIVE
Influenza B by PCR: NEGATIVE
Resp Syncytial Virus by PCR: NEGATIVE
SARS Coronavirus 2 by RT PCR: NEGATIVE

## 2024-03-04 LAB — GROUP A STREP BY PCR: Group A Strep by PCR: NOT DETECTED

## 2024-03-04 MED ORDER — LIDOCAINE VISCOUS HCL 2 % MT SOLN: 15.0000 mL | OROMUCOSAL | 0 refills | Status: AC | PRN

## 2024-03-19 DIAGNOSIS — R0789 Other chest pain: Secondary | ICD-10-CM | POA: Diagnosis not present

## 2024-03-19 DIAGNOSIS — F1011 Alcohol abuse, in remission: Secondary | ICD-10-CM | POA: Diagnosis not present

## 2024-03-23 DIAGNOSIS — N760 Acute vaginitis: Secondary | ICD-10-CM | POA: Diagnosis not present

## 2024-03-23 DIAGNOSIS — R300 Dysuria: Secondary | ICD-10-CM | POA: Diagnosis not present

## 2024-03-23 DIAGNOSIS — Z3202 Encounter for pregnancy test, result negative: Secondary | ICD-10-CM | POA: Diagnosis not present

## 2024-04-11 DIAGNOSIS — F902 Attention-deficit hyperactivity disorder, combined type: Secondary | ICD-10-CM | POA: Diagnosis not present

## 2024-04-11 DIAGNOSIS — Z79899 Other long term (current) drug therapy: Secondary | ICD-10-CM | POA: Diagnosis not present

## 2024-04-16 DIAGNOSIS — J069 Acute upper respiratory infection, unspecified: Secondary | ICD-10-CM | POA: Diagnosis not present

## 2024-04-16 DIAGNOSIS — B009 Herpesviral infection, unspecified: Secondary | ICD-10-CM | POA: Diagnosis not present

## 2024-05-06 DIAGNOSIS — B9689 Other specified bacterial agents as the cause of diseases classified elsewhere: Secondary | ICD-10-CM | POA: Diagnosis not present

## 2024-05-06 DIAGNOSIS — J069 Acute upper respiratory infection, unspecified: Secondary | ICD-10-CM | POA: Diagnosis not present

## 2024-06-05 ENCOUNTER — Emergency Department (HOSPITAL_BASED_OUTPATIENT_CLINIC_OR_DEPARTMENT_OTHER)

## 2024-06-05 ENCOUNTER — Encounter (HOSPITAL_BASED_OUTPATIENT_CLINIC_OR_DEPARTMENT_OTHER): Payer: Self-pay

## 2024-06-05 ENCOUNTER — Other Ambulatory Visit: Payer: Self-pay

## 2024-06-05 DIAGNOSIS — K760 Fatty (change of) liver, not elsewhere classified: Secondary | ICD-10-CM | POA: Diagnosis not present

## 2024-06-05 DIAGNOSIS — R197 Diarrhea, unspecified: Secondary | ICD-10-CM | POA: Diagnosis not present

## 2024-06-05 DIAGNOSIS — Z79899 Other long term (current) drug therapy: Secondary | ICD-10-CM | POA: Insufficient documentation

## 2024-06-05 DIAGNOSIS — R1031 Right lower quadrant pain: Secondary | ICD-10-CM | POA: Diagnosis present

## 2024-06-05 LAB — COMPREHENSIVE METABOLIC PANEL WITH GFR
ALT: 44 U/L (ref 0–44)
AST: 41 U/L (ref 15–41)
Albumin: 4.8 g/dL (ref 3.5–5.0)
Alkaline Phosphatase: 37 U/L — ABNORMAL LOW (ref 38–126)
Anion gap: 12 (ref 5–15)
BUN: 10 mg/dL (ref 6–20)
CO2: 27 mmol/L (ref 22–32)
Calcium: 9.7 mg/dL (ref 8.9–10.3)
Chloride: 99 mmol/L (ref 98–111)
Creatinine, Ser: 0.76 mg/dL (ref 0.44–1.00)
GFR, Estimated: >60 mL/min
Glucose, Bld: 98 mg/dL (ref 70–99)
Potassium: 3.9 mmol/L (ref 3.5–5.1)
Sodium: 137 mmol/L (ref 135–145)
Total Bilirubin: 0.5 mg/dL (ref 0.0–1.2)
Total Protein: 7.2 g/dL (ref 6.5–8.1)

## 2024-06-05 LAB — URINALYSIS, ROUTINE W REFLEX MICROSCOPIC
Bilirubin Urine: NEGATIVE
Glucose, UA: NEGATIVE mg/dL
Hgb urine dipstick: NEGATIVE
Ketones, ur: NEGATIVE mg/dL
Leukocytes,Ua: NEGATIVE
Nitrite: NEGATIVE
Protein, ur: NEGATIVE mg/dL
Specific Gravity, Urine: 1.01 (ref 1.005–1.030)
pH: 7.5 (ref 5.0–8.0)

## 2024-06-05 LAB — CBC WITH DIFFERENTIAL/PLATELET
Abs Immature Granulocytes: 0.02 K/uL (ref 0.00–0.07)
Basophils Absolute: 0 K/uL (ref 0.0–0.1)
Basophils Relative: 1 %
Eosinophils Absolute: 0.1 K/uL (ref 0.0–0.5)
Eosinophils Relative: 2 %
HCT: 37.9 % (ref 36.0–46.0)
Hemoglobin: 13.6 g/dL (ref 12.0–15.0)
Immature Granulocytes: 0 %
Lymphocytes Relative: 36 %
Lymphs Abs: 2.1 K/uL (ref 0.7–4.0)
MCH: 31.6 pg (ref 26.0–34.0)
MCHC: 35.9 g/dL (ref 30.0–36.0)
MCV: 88.1 fL (ref 80.0–100.0)
Monocytes Absolute: 0.5 K/uL (ref 0.1–1.0)
Monocytes Relative: 8 %
Neutro Abs: 3.1 K/uL (ref 1.7–7.7)
Neutrophils Relative %: 53 %
Platelets: 254 K/uL (ref 150–400)
RBC: 4.3 MIL/uL (ref 3.87–5.11)
RDW: 11.3 % — ABNORMAL LOW (ref 11.5–15.5)
WBC: 5.8 K/uL (ref 4.0–10.5)
nRBC: 0 % (ref 0.0–0.2)

## 2024-06-05 LAB — PREGNANCY, URINE: Preg Test, Ur: NEGATIVE

## 2024-06-05 MED ORDER — IOHEXOL 300 MG/ML  SOLN
100.0000 mL | Freq: Once | INTRAMUSCULAR | Status: AC | PRN
  Administered 2024-06-05: 100 mL via INTRAVENOUS

## 2024-06-05 NOTE — ED Notes (Signed)
 Pt taken to CT at this time from the lobby.

## 2024-06-05 NOTE — ED Notes (Signed)
 Pt returned to the lobby from CT at this time.

## 2024-06-05 NOTE — ED Triage Notes (Signed)
 Pt states after her shower she experienced some blurred vision, resolving now Abdominal pain rt LQ pain x 4 days Denies N/V  +diarrhea

## 2024-06-06 ENCOUNTER — Emergency Department (HOSPITAL_BASED_OUTPATIENT_CLINIC_OR_DEPARTMENT_OTHER)
Admission: EM | Admit: 2024-06-06 | Discharge: 2024-06-06 | Disposition: A | Discharge status: Home / Self Care | Attending: Emergency Medicine | Admitting: Emergency Medicine

## 2024-06-06 DIAGNOSIS — K760 Fatty (change of) liver, not elsewhere classified: Secondary | ICD-10-CM

## 2024-06-06 DIAGNOSIS — R197 Diarrhea, unspecified: Secondary | ICD-10-CM

## 2024-06-06 MED ORDER — KETOROLAC TROMETHAMINE 30 MG/ML IJ SOLN
30.0000 mg | Freq: Once | INTRAMUSCULAR | Status: AC
  Administered 2024-06-06: 30 mg via INTRAVENOUS
  Filled 2024-06-06: qty 1

## 2024-06-06 NOTE — ED Provider Notes (Signed)
 " [place] [facility] Provider Note   CSN: [id number] Arrival date & time: 06/05/24  2231     Patient presents with: Abdominal Pain   Kathleen Carrillo is a 34 y.o. female.   The history is provided by the patient.  Abdominal Pain Pain location:  R flank, RLQ and RUQ Pain quality: aching   Pain radiates to:  Does not radiate Pain severity:  Moderate Onset quality:  Gradual Duration:  4 days Timing:  Constant Progression:  Unchanged Chronicity:  New Context: not eating and not laxative use   Relieved by:  Nothing Worsened by:  Nothing Associated symptoms: diarrhea   Associated symptoms: no anorexia, no dysuria, no fever and no vomiting   Risk factors: alcohol abuse     Visual Acuity  Right Eye Distance: 20/20 Left Eye Distance: 20/20 Bilateral Distance: 20/20  Right Eye Near: R Near: 20/20 Left Eye Near:  L Near: 20/20 Bilateral Near:  20/20      Prior to Admission medications  Medication Sig Start Date End Date Taking? Authorizing Provider  cephALEXin  (KEFLEX ) 500 MG capsule Take 1 capsule (500 mg total) by mouth 3 (three) times daily. 03/09/21   Gershon Donnice SAUNDERS, DPM  cephALEXin  (KEFLEX ) 500 MG capsule Take 1 capsule (500 mg total) by mouth 3 (three) times daily. 06/03/21   Gershon Donnice SAUNDERS, DPM  ciprofloxacin  (CIPRO ) 500 MG tablet Take 1 tablet (500 mg total) by mouth 2 (two) times daily. 03/16/21   Gershon Donnice SAUNDERS, DPM  DERMA-SMOOTHE/FS SCALP 0.01 % OIL SMARTSIG:1 Sparingly Topical Twice a Week 12/17/20   [provider]  ibuprofen  (ADVIL ) 800 MG tablet Take 1 tablet (800 mg total) by mouth every 8 (eight) hours as needed. 05/26/21   Gershon Donnice SAUNDERS, DPM  itraconazole  (SPORANOX ) 100 MG capsule Take 1 capsule (100 mg total) by mouth 2 (two) times daily. Take for one week per month for 3 months 06/10/21   Gershon Donnice SAUNDERS, DPM  lidocaine  (LIDODERM ) 5 % Place 1 patch onto the skin daily. Remove & Discard patch within  12 hours or as directed by MD 03/16/21   Gershon Donnice SAUNDERS, DPM  lidocaine  (XYLOCAINE ) 2 % solution Use as directed 15 mLs in the mouth or throat as needed for mouth pain. 03/04/24   Countryman, Chase, MD  medroxyPROGESTERone  Acetate 150 MG/ML SUSY Inject 1 mL (150 mg total) into the muscle every 3 (three) months. 05/02/22     omeprazole (PRILOSEC) 40 MG capsule TAKE 1 CAPSULE BY MOUTH ONCE DAILY IN THE MORNING 30 MINUTES BEFORE MORNING MEAL 10/23/19   [provider]  oxyCODONE  (ROXICODONE ) 5 MG immediate release tablet Take 1 tablet (5 mg total) by mouth every 6 (six) hours as needed for severe pain. 05/26/21   Gershon Donnice SAUNDERS, DPM  promethazine  (PHENERGAN ) 25 MG tablet Take 1 tablet (25 mg total) by mouth every 8 (eight) hours as needed for nausea or vomiting. 05/26/21   Gershon Donnice SAUNDERS, DPM  valACYclovir  (VALTREX ) 500 MG tablet Take 500 mg by mouth daily. 02/16/21   [provider]    Allergies: Acetaminophen-dm, Nickel, Pseudoephedrine hcl, Sudafed [pseudoephedrine hcl], and Dayquil [pseudoephedrine-dm-gg-apap]    Review of Systems  Constitutional:  Negative for fever.  Eyes:  Negative for photophobia and visual disturbance.  Gastrointestinal:  Positive for abdominal pain and diarrhea. Negative for anorexia and vomiting.  Genitourinary:  Negative for dysuria.  All other systems reviewed and are negative.   Updated Vital Signs BP  118/74 (BP Location: Right Arm)   Pulse 84   Temp 97.9 F (36.6 C) (Oral)   Resp 18   Ht 5' 3 (1.6 m)   Wt 59 kg   SpO2 98%   BMI 23.03 kg/m   Physical Exam Vitals and nursing note reviewed. Exam conducted with a chaperone present.  Constitutional:      General: She is not in acute distress.    Appearance: Normal appearance. She is well-developed.  HENT:     Head: Normocephalic and atraumatic.     Nose: Nose normal.  Eyes:     Pupils: Pupils are equal, round, and reactive to light.  Cardiovascular:     Rate and Rhythm: Normal  rate and regular rhythm.     Pulses: Normal pulses.     Heart sounds: Normal heart sounds.  Pulmonary:     Effort: Pulmonary effort is normal. No respiratory distress.     Breath sounds: Normal breath sounds. No wheezing or rales.  Abdominal:     General: Bowel sounds are normal. There is no distension.     Palpations: Abdomen is soft.     Tenderness: There is no abdominal tenderness. There is no guarding or rebound.     Hernia: No hernia is present.  Musculoskeletal:        General: Normal range of motion.     Cervical back: Neck supple.  Skin:    General: Skin is dry.     Capillary Refill: Capillary refill takes less than 2 seconds.     Findings: No erythema or rash.  Neurological:     General: No focal deficit present.     Mental Status: She is alert and oriented to person, place, and time.     Deep Tendon Reflexes: Reflexes normal.  Psychiatric:        Mood and Affect: Mood normal.     (all labs ordered are listed, but only abnormal results are displayed) Results for orders placed or performed during the hospital encounter of 06/06/24  Comprehensive metabolic panel   Collection Time: 06/05/24 10:42 PM  Result Value Ref Range   Sodium 137 135 - 145 mmol/L   Potassium 3.9 3.5 - 5.1 mmol/L   Chloride 99 98 - 111 mmol/L   CO2 27 22 - 32 mmol/L   Glucose, Bld 98 70 - 99 mg/dL   BUN 10 6 - 20 mg/dL   Creatinine, Ser 9.23 0.44 - 1.00 mg/dL   Calcium 9.7 8.9 - 89.6 mg/dL   Total Protein 7.2 6.5 - 8.1 g/dL   Albumin 4.8 3.5 - 5.0 g/dL   AST 41 15 - 41 U/L   ALT 44 0 - 44 U/L   Alkaline Phosphatase 37 (L) 38 - 126 U/L   Total Bilirubin 0.5 0.0 - 1.2 mg/dL   GFR, Estimated >39 >39 mL/min   Anion gap 12 5 - 15  CBC with Differential   Collection Time: 06/05/24 10:42 PM  Result Value Ref Range   WBC 5.8 4.0 - 10.5 K/uL   RBC 4.30 3.87 - 5.11 MIL/uL   Hemoglobin 13.6 12.0 - 15.0 g/dL   HCT 62.0 63.9 - 53.9 %   MCV 88.1 80.0 - 100.0 fL   MCH 31.6 26.0 - 34.0 pg   MCHC  35.9 30.0 - 36.0 g/dL   RDW 88.6 (L) 88.4 - 84.4 %   Platelets 254 150 - 400 K/uL   nRBC 0.0 0.0 - 0.2 %   Neutrophils Relative %  53 %   Neutro Abs 3.1 1.7 - 7.7 K/uL   Lymphocytes Relative 36 %   Lymphs Abs 2.1 0.7 - 4.0 K/uL   Monocytes Relative 8 %   Monocytes Absolute 0.5 0.1 - 1.0 K/uL   Eosinophils Relative 2 %   Eosinophils Absolute 0.1 0.0 - 0.5 K/uL   Basophils Relative 1 %   Basophils Absolute 0.0 0.0 - 0.1 K/uL   Immature Granulocytes 0 %   Abs Immature Granulocytes 0.02 0.00 - 0.07 K/uL  Urinalysis, Routine w reflex microscopic -Urine, Clean Catch   Collection Time: 06/05/24 10:42 PM  Result Value Ref Range   Color, Urine STRAW (A) YELLOW   APPearance CLEAR CLEAR   Specific Gravity, Urine 1.010 1.005 - 1.030   pH 7.5 5.0 - 8.0   Glucose, UA NEGATIVE NEGATIVE mg/dL   Hgb urine dipstick NEGATIVE NEGATIVE   Bilirubin Urine NEGATIVE NEGATIVE   Ketones, ur NEGATIVE NEGATIVE mg/dL   Protein, ur NEGATIVE NEGATIVE mg/dL   Nitrite NEGATIVE NEGATIVE   Leukocytes,Ua NEGATIVE NEGATIVE  Pregnancy, urine   Collection Time: 06/05/24 10:42 PM  Result Value Ref Range   Preg Test, Ur NEGATIVE NEGATIVE   CT ABDOMEN PELVIS W CONTRAST Result Date: 06/05/2024 EXAM: CT ABDOMEN AND PELVIS WITH CONTRAST 06/05/2024 11:51:44 PM TECHNIQUE: CT of the abdomen and pelvis was performed with the administration of 100 mL of iohexol  (OMNIPAQUE ) 300 MG/ML solution. Multiplanar reformatted images are provided for review. Automated exposure control, iterative reconstruction, and/or weight-based adjustment of the mA/kV was utilized to reduce the radiation dose to as low as reasonably achievable. COMPARISON: Findings are compared to 07/03/2020. CLINICAL HISTORY: Polytrauma, blunt. Right mid abdominal pain, subjective jaundice. FINDINGS: LOWER CHEST: No acute abnormality. LIVER: Moderate hepatic steatosis and mild hepatomegaly, stable. GALLBLADDER AND BILE DUCTS: Gallbladder is unremarkable. No biliary ductal  dilatation. SPLEEN: No acute abnormality. PANCREAS: No acute abnormality. ADRENAL GLANDS: No acute abnormality. KIDNEYS, URETERS AND BLADDER: No stones in the kidneys or ureters. No hydronephrosis. No perinephric or periureteral stranding. Urinary bladder is unremarkable. GI AND BOWEL: Stomach demonstrates no acute abnormality. There is no bowel obstruction. PERITONEUM AND RETROPERITONEUM: No ascites. No free air. VASCULATURE: Aorta is normal in caliber. LYMPH NODES: No lymphadenopathy. REPRODUCTIVE ORGANS: Intrauterine device in expected position. Pelvic organs are otherwise unremarkable. BONES AND SOFT TISSUES: No acute osseous abnormality. No focal soft tissue abnormality. IMPRESSION: 1. No acute abdominopelvic abnormality. 2. Moderate hepatic steatosis with mild hepatomegaly, stable. 3. Intrauterine device in expected position. Electronically signed by: Dorethia Molt MD 06/05/2024 11:58 PM EST RP Workstation: [id number]    Radiology: CT ABDOMEN PELVIS W CONTRAST Result Date: 06/05/2024 EXAM: CT ABDOMEN AND PELVIS WITH CONTRAST 06/05/2024 11:51:44 PM TECHNIQUE: CT of the abdomen and pelvis was performed with the administration of 100 mL of iohexol  (OMNIPAQUE ) 300 MG/ML solution. Multiplanar reformatted images are provided for review. Automated exposure control, iterative reconstruction, and/or weight-based adjustment of the mA/kV was utilized to reduce the radiation dose to as low as reasonably achievable. COMPARISON: Findings are compared to 07/03/2020. CLINICAL HISTORY: Polytrauma, blunt. Right mid abdominal pain, subjective jaundice. FINDINGS: LOWER CHEST: No acute abnormality. LIVER: Moderate hepatic steatosis and mild hepatomegaly, stable. GALLBLADDER AND BILE DUCTS: Gallbladder is unremarkable. No biliary ductal dilatation. SPLEEN: No acute abnormality. PANCREAS: No acute abnormality. ADRENAL GLANDS: No acute abnormality. KIDNEYS, URETERS AND BLADDER: No stones in the kidneys or ureters. No  hydronephrosis. No perinephric or periureteral stranding. Urinary bladder is unremarkable. GI AND BOWEL: Stomach demonstrates no acute abnormality. There  is no bowel obstruction. PERITONEUM AND RETROPERITONEUM: No ascites. No free air. VASCULATURE: Aorta is normal in caliber. LYMPH NODES: No lymphadenopathy. REPRODUCTIVE ORGANS: Intrauterine device in expected position. Pelvic organs are otherwise unremarkable. BONES AND SOFT TISSUES: No acute osseous abnormality. No focal soft tissue abnormality. IMPRESSION: 1. No acute abdominopelvic abnormality. 2. Moderate hepatic steatosis with mild hepatomegaly, stable. 3. Intrauterine device in expected position. Electronically signed by: Dorethia Molt MD 06/05/2024 11:58 PM EST RP Workstation: [id number]     Procedures   Medications Ordered in the ED  iohexol  (OMNIPAQUE ) 300 MG/ML solution 100 mL (100 mLs Intravenous Contrast Given 06/05/24 2341)  ketorolac  (TORADOL ) 30 MG/ML injection 30 mg (30 mg Intravenous Given 06/06/24 0058)                                    Medical Decision Making Diarrhea and right abdominal pain   Amount and/or Complexity of Data Reviewed Labs: ordered.    Details: Normal white count 5.8, normal hemoglobin 13.6, normal platelets normal sodium 137, normal potassium 3.9, normal creatinine normal LFTs urine is without UTI.  Not pregnant  Radiology: ordered and independent interpretation performed.    Details: No acute finding on CT by me   Risk Prescription drug management. Risk Details: Well appearing, exam vitals, labs and imaging are benign and reassuring.  Bland diet instructions given.  Stable for discharge with close follow up.  Informed verbally and in writing of fatty liver and need for close follow up with the PMD.      Final diagnoses:  Diarrhea, unspecified type  Fatty liver   No signs of systemic illness or infection. The patient is nontoxic-appearing on exam and vital signs are within normal limits.  I have  reviewed the triage vital signs and the nursing notes. Pertinent labs & imaging results that were available during my care of the patient were reviewed by me and considered in my medical decision making (see chart for details). After history, exam, and medical workup I feel the patient has been appropriately medically screened and is safe for discharge home. Pertinent diagnoses were discussed with the patient. Patient was given return precautions.    ED Discharge Orders     None          Karson Reede, MD 06/06/24 0415  "

## 2024-06-09 ENCOUNTER — Emergency Department (HOSPITAL_BASED_OUTPATIENT_CLINIC_OR_DEPARTMENT_OTHER)
Admission: EM | Admit: 2024-06-09 | Discharge: 2024-06-09 | Disposition: A | Discharge status: Home / Self Care | Attending: Emergency Medicine | Admitting: Emergency Medicine

## 2024-06-09 ENCOUNTER — Other Ambulatory Visit: Payer: Self-pay

## 2024-06-09 DIAGNOSIS — R197 Diarrhea, unspecified: Secondary | ICD-10-CM | POA: Diagnosis not present

## 2024-06-09 DIAGNOSIS — R000 Tachycardia, unspecified: Secondary | ICD-10-CM | POA: Insufficient documentation

## 2024-06-09 DIAGNOSIS — F1010 Alcohol abuse, uncomplicated: Secondary | ICD-10-CM | POA: Insufficient documentation

## 2024-06-09 DIAGNOSIS — F1090 Alcohol use, unspecified, uncomplicated: Secondary | ICD-10-CM

## 2024-06-09 LAB — COMPREHENSIVE METABOLIC PANEL WITH GFR
ALT: 46 U/L — ABNORMAL HIGH (ref 0–44)
AST: 30 U/L (ref 15–41)
Albumin: 4.6 g/dL (ref 3.5–5.0)
Alkaline Phosphatase: 34 U/L — ABNORMAL LOW (ref 38–126)
Anion gap: 14 (ref 5–15)
BUN: 8 mg/dL (ref 6–20)
CO2: 25 mmol/L (ref 22–32)
Calcium: 9.3 mg/dL (ref 8.9–10.3)
Chloride: 100 mmol/L (ref 98–111)
Creatinine, Ser: 1.05 mg/dL — ABNORMAL HIGH (ref 0.44–1.00)
GFR, Estimated: >60 mL/min
Glucose, Bld: 86 mg/dL (ref 70–99)
Potassium: 3.9 mmol/L (ref 3.5–5.1)
Sodium: 138 mmol/L (ref 135–145)
Total Bilirubin: 0.3 mg/dL (ref 0.0–1.2)
Total Protein: 7.3 g/dL (ref 6.5–8.1)

## 2024-06-09 LAB — HCG, SERUM, QUALITATIVE: Preg, Serum: NEGATIVE

## 2024-06-09 LAB — CBC WITH DIFFERENTIAL/PLATELET
Abs Immature Granulocytes: 0.03 K/uL (ref 0.00–0.07)
Basophils Absolute: 0.1 K/uL (ref 0.0–0.1)
Basophils Relative: 1 %
Eosinophils Absolute: 0.2 K/uL (ref 0.0–0.5)
Eosinophils Relative: 4 %
HCT: 41.2 % (ref 36.0–46.0)
Hemoglobin: 14.6 g/dL (ref 12.0–15.0)
Immature Granulocytes: 1 %
Lymphocytes Relative: 45 %
Lymphs Abs: 2.2 K/uL (ref 0.7–4.0)
MCH: 31.3 pg (ref 26.0–34.0)
MCHC: 35.4 g/dL (ref 30.0–36.0)
MCV: 88.2 fL (ref 80.0–100.0)
Monocytes Absolute: 0.4 K/uL (ref 0.1–1.0)
Monocytes Relative: 8 %
Neutro Abs: 2 K/uL (ref 1.7–7.7)
Neutrophils Relative %: 41 %
Platelets: 321 K/uL (ref 150–400)
RBC: 4.67 MIL/uL (ref 3.87–5.11)
RDW: 11.4 % — ABNORMAL LOW (ref 11.5–15.5)
WBC: 4.8 K/uL (ref 4.0–10.5)
nRBC: 0 % (ref 0.0–0.2)

## 2024-06-09 MED ORDER — SODIUM CHLORIDE 0.9 % IV BOLUS
1000.0000 mL | Freq: Once | INTRAVENOUS | Status: AC
  Administered 2024-06-09: 1000 mL via INTRAVENOUS

## 2024-06-09 MED ORDER — CHLORDIAZEPOXIDE HCL 25 MG PO CAPS: ORAL_CAPSULE | ORAL | 0 refills | Status: AC

## 2024-06-09 NOTE — ED Triage Notes (Signed)
 Pt arrives with c/o palpitations that started yesterday. Pt reports she was trying to quit drinking cold turkey. Pt had been 2-3 days without alcohol, but then drank a bottle of wine today because she read where it was unsafe to quit cold turkey.

## 2024-06-09 NOTE — ED Provider Notes (Signed)
 " [facility] Provider Note   CSN: [id number] Arrival date & time: 06/09/24  2117     Patient presents with: Palpitations   Kathleen Carrillo is a 34 y.o. female.   Patient is a 34 year old female with a history of alcohol use disorder who presents with tachycardia and palpitations.  She said that 2 days ago she decided to quit drinking alcohol cold turkey.  She previously was drinking about 5-6 drinks a day.  She said that yesterday she was feeling jittery and her heart was beating fast.  She said it was beating around 120.  She was feeling a little shaky.  Last night she researched that it was dangerous to stop cold turkey and today she got nervous about that and went and bought a bottle of wine and drink the bottle of wine.  Her last drink was about 4 hours ago.  She said she feels less shaky but her heart rate has still been little high.  She denies any other symptoms.  No cough or cold symptoms.  No nausea or vomiting.  She has had a little diarrhea which she attributed to the alcohol use.  No current abdominal pain.  No urinary symptoms.  No fevers.  No chest pain or shortness of breath.       Prior to Admission medications  Medication Sig Start Date End Date Taking? Authorizing Provider  chlordiazePOXIDE  (LIBRIUM ) 25 MG capsule 50mg  PO TID x 1D, then 25-50mg  PO BID X 1D, then 25-50mg  PO QD X 1D 06/09/24  Yes Lenor Hollering, MD  cephALEXin  (KEFLEX ) 500 MG capsule Take 1 capsule (500 mg total) by mouth 3 (three) times daily. 03/09/21   Gershon Donnice SAUNDERS, DPM  cephALEXin  (KEFLEX ) 500 MG capsule Take 1 capsule (500 mg total) by mouth 3 (three) times daily. 06/03/21   Gershon Donnice SAUNDERS, DPM  ciprofloxacin  (CIPRO ) 500 MG tablet Take 1 tablet (500 mg total) by mouth 2 (two) times daily. 03/16/21   Gershon Donnice SAUNDERS, DPM  DERMA-SMOOTHE/FS SCALP 0.01 % OIL SMARTSIG:1 Sparingly Topical Twice a Week 12/17/20   [provider]  ibuprofen  (ADVIL )  800 MG tablet Take 1 tablet (800 mg total) by mouth every 8 (eight) hours as needed. 05/26/21   Gershon Donnice SAUNDERS, DPM  lidocaine  (LIDODERM ) 5 % Place 1 patch onto the skin daily. Remove & Discard patch within 12 hours or as directed by MD 03/16/21   Gershon Donnice SAUNDERS, DPM  lidocaine  (XYLOCAINE ) 2 % solution Use as directed 15 mLs in the mouth or throat as needed for mouth pain. 03/04/24   Jerral Meth, MD  medroxyPROGESTERone  Acetate 150 MG/ML SUSY Inject 1 mL (150 mg total) into the muscle every 3 (three) months. 05/02/22     omeprazole (PRILOSEC) 40 MG capsule TAKE 1 CAPSULE BY MOUTH ONCE DAILY IN THE MORNING 30 MINUTES BEFORE MORNING MEAL 10/23/19   [provider]  oxyCODONE  (ROXICODONE ) 5 MG immediate release tablet Take 1 tablet (5 mg total) by mouth every 6 (six) hours as needed for severe pain. 05/26/21   Gershon Donnice SAUNDERS, DPM  promethazine  (PHENERGAN ) 25 MG tablet Take 1 tablet (25 mg total) by mouth every 8 (eight) hours as needed for nausea or vomiting. 05/26/21   Gershon Donnice SAUNDERS, DPM  valACYclovir  (VALTREX ) 500 MG tablet Take 500 mg by mouth daily. 02/16/21   [provider]    Allergies: Acetaminophen-dm, Nickel, Pseudoephedrine hcl, Sudafed [pseudoephedrine hcl], and Dayquil [pseudoephedrine-dm-gg-apap]  Review of Systems  Constitutional:  Negative for chills, diaphoresis, fatigue and fever.  HENT:  Negative for congestion, rhinorrhea and sneezing.   Eyes: Negative.   Respiratory:  Negative for cough, chest tightness and shortness of breath.   Cardiovascular:  Positive for palpitations. Negative for chest pain and leg swelling.  Gastrointestinal:  Positive for diarrhea. Negative for abdominal pain, nausea and vomiting.  Genitourinary:  Negative for difficulty urinating, flank pain and frequency.  Musculoskeletal:  Negative for arthralgias and back pain.  Skin:  Negative for rash.  Neurological:  Positive for tremors. Negative for dizziness, speech  difficulty, weakness, numbness and headaches.  Psychiatric/Behavioral:  The patient is nervous/anxious.     Updated Vital Signs BP 101/85   Pulse 89   Temp 98.7 F (37.1 C) (Oral)   Resp 17   Wt 59 kg   SpO2 99%   BMI 23.03 kg/m   Physical Exam Constitutional:      Appearance: She is well-developed.  HENT:     Head: Normocephalic and atraumatic.  Eyes:     Pupils: Pupils are equal, round, and reactive to light.  Cardiovascular:     Rate and Rhythm: Normal rate and regular rhythm.     Heart sounds: Normal heart sounds.  Pulmonary:     Effort: Pulmonary effort is normal. No respiratory distress.     Breath sounds: Normal breath sounds. No wheezing or rales.  Chest:     Chest wall: No tenderness.  Abdominal:     General: Bowel sounds are normal.     Palpations: Abdomen is soft.     Tenderness: There is no abdominal tenderness. There is no guarding or rebound.  Musculoskeletal:        General: Normal range of motion.     Cervical back: Normal range of motion and neck supple.     Comments: No tremor  Lymphadenopathy:     Cervical: No cervical adenopathy.  Skin:    General: Skin is warm and dry.     Findings: No rash.  Neurological:     General: No focal deficit present.     Mental Status: She is alert and oriented to person, place, and time.     (all labs ordered are listed, but only abnormal results are displayed) Labs Reviewed  COMPREHENSIVE METABOLIC PANEL WITH GFR - Abnormal; Notable for the following components:      Result Value   Creatinine, Ser 1.05 (*)    ALT 46 (*)    Alkaline Phosphatase 34 (*)    All other components within normal limits  CBC WITH DIFFERENTIAL/PLATELET - Abnormal; Notable for the following components:   RDW 11.4 (*)    All other components within normal limits  HCG, SERUM, QUALITATIVE  URINALYSIS, ROUTINE W REFLEX MICROSCOPIC    EKG: None  Radiology: No results found.   Procedures   Medications Ordered in the ED  sodium  chloride 0.9 % bolus 1,000 mL (0 mLs Intravenous Stopped 06/09/24 2259)                                    Medical Decision Making Amount and/or Complexity of Data Reviewed Labs: ordered.  Risk Prescription drug management.   This patient presents to the ED for concern of palpitations, this involves an extensive number of treatment options, and is a complaint that carries with it a high risk of complications and morbidity.  I  considered the following differential and admission for this acute, potentially life threatening condition.  The differential diagnosis includes arrhythmia, dehydration, alcohol withdrawal, anemia, electrolyte abnormality, infection  MDM:    Patient is a 34 year old who presents with anxiety and palpitations.  Her heart rate is in the low 100s.  On my exam, heart rate is in the 90s but when she starts talking it we will go up to around 100.  She is in a sinus rhythm.  Her EKG does not show any concerning findings other than mildly prolonged QT interval.  She does not currently appear to be tremulous or having acute withdrawal symptoms.  She was given IV fluids.  Her heart rate has normalized into the 80s.  She does not currently have any withdrawal symptoms.  She is feeling better.  Labs reviewed and are nonconcerning.  No significant anemia.  Her LFTs are minimally elevated.  Discussed options with her.  She is interested in quitting alcohol use and is interested in trying Librium .  Will give her a prescription for a Librium  taper.  Will give her resources first for outpatient follow-up.  She was discharged home in good condition.  Return precautions were given.  (Labs, imaging, consults)  Labs: I Ordered, and personally interpreted labs.  The pertinent results include: No significant anemia, LFTs mildly elevated  Imaging Studies ordered: I ordered imaging studies including   I independently visualized and interpreted imaging. I agree with the radiologist  interpretation  Additional history obtained from  .  External records from outside source obtained and reviewed including    Cardiac Monitoring: The patient was maintained on a cardiac monitor.  If on the cardiac monitor, I personally viewed and interpreted the cardiac monitored which showed an underlying rhythm of: Sinus rhythm  Reevaluation: After the interventions noted above, I reevaluated the patient and found that they have :improved  Social Determinants of Health:  alcohol use  Disposition: Discharged to home  Co morbidities that complicate the patient evaluation  Past Medical History:  Diagnosis Date   Benign breast lumps    IUD contraception 10/2010   mirena   LGSIL (low grade squamous intraepithelial dysplasia) 2013     Medicines Meds ordered this encounter  Medications   sodium chloride  0.9 % bolus 1,000 mL   chlordiazePOXIDE  (LIBRIUM ) 25 MG capsule    Sig: 50mg  PO TID x 1D, then 25-50mg  PO BID X 1D, then 25-50mg  PO QD X 1D    Dispense:  10 capsule    Refill:  0    I have reviewed the patients home medicines and have made adjustments as needed  Problem List / ED Course: Problem List Items Addressed This Visit   None Visit Diagnoses       Tachycardia    -  Primary     Alcohol use disorder                    Final diagnoses:  Tachycardia  Alcohol use disorder    ED Discharge Orders          Ordered    chlordiazePOXIDE  (LIBRIUM ) 25 MG capsule        06/09/24 2310               Lenor Hollering, MD 06/09/24 2312  "
